# Patient Record
Sex: Female | Born: 1980 | Race: White | Hispanic: No | Marital: Married | Smoking: Current every day smoker
Health system: Southern US, Community
[De-identification: ages and names within clinical notes are randomized; demographics above are authoritative.]

## PROBLEM LIST (undated history)

## (undated) ENCOUNTER — Inpatient Hospital Stay (HOSPITAL_COMMUNITY): Payer: Self-pay

## (undated) DIAGNOSIS — T8859XA Other complications of anesthesia, initial encounter: Secondary | ICD-10-CM

## (undated) DIAGNOSIS — K219 Gastro-esophageal reflux disease without esophagitis: Secondary | ICD-10-CM

## (undated) DIAGNOSIS — Z87442 Personal history of urinary calculi: Secondary | ICD-10-CM

## (undated) DIAGNOSIS — R3915 Urgency of urination: Secondary | ICD-10-CM

## (undated) DIAGNOSIS — F419 Anxiety disorder, unspecified: Secondary | ICD-10-CM

## (undated) DIAGNOSIS — K5090 Crohn's disease, unspecified, without complications: Secondary | ICD-10-CM

## (undated) DIAGNOSIS — T4145XA Adverse effect of unspecified anesthetic, initial encounter: Secondary | ICD-10-CM

## (undated) DIAGNOSIS — N201 Calculus of ureter: Secondary | ICD-10-CM

## (undated) HISTORY — DX: Anxiety disorder, unspecified: F41.9

## (undated) HISTORY — PX: TONSILLECTOMY AND ADENOIDECTOMY: SUR1326

---
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
## 2001-04-15 ENCOUNTER — Emergency Department (HOSPITAL_COMMUNITY): Admission: AC | Admit: 2001-04-15 | Discharge: 2001-04-15 | Payer: Self-pay

## 2001-04-15 ENCOUNTER — Encounter: Payer: Self-pay | Admitting: Emergency Medicine

## 2001-04-17 ENCOUNTER — Emergency Department (HOSPITAL_COMMUNITY): Admission: EM | Admit: 2001-04-17 | Discharge: 2001-04-17 | Payer: Self-pay | Admitting: Emergency Medicine

## 2001-05-23 ENCOUNTER — Emergency Department (HOSPITAL_COMMUNITY): Admission: EM | Admit: 2001-05-23 | Discharge: 2001-05-23 | Payer: Self-pay | Admitting: Emergency Medicine

## 2001-08-28 ENCOUNTER — Emergency Department (HOSPITAL_COMMUNITY): Admission: EM | Admit: 2001-08-28 | Discharge: 2001-08-28 | Payer: Self-pay | Admitting: *Deleted

## 2001-10-03 ENCOUNTER — Emergency Department (HOSPITAL_COMMUNITY): Admission: EM | Admit: 2001-10-03 | Discharge: 2001-10-03 | Payer: Self-pay | Admitting: Emergency Medicine

## 2002-01-15 ENCOUNTER — Emergency Department (HOSPITAL_COMMUNITY): Admission: EM | Admit: 2002-01-15 | Discharge: 2002-01-15 | Payer: Self-pay | Admitting: Emergency Medicine

## 2002-04-07 ENCOUNTER — Emergency Department (HOSPITAL_COMMUNITY): Admission: EM | Admit: 2002-04-07 | Discharge: 2002-04-07 | Payer: Self-pay | Admitting: Emergency Medicine

## 2002-04-29 ENCOUNTER — Emergency Department (HOSPITAL_COMMUNITY): Admission: EM | Admit: 2002-04-29 | Discharge: 2002-04-29 | Payer: Self-pay | Admitting: Emergency Medicine

## 2002-06-15 ENCOUNTER — Emergency Department (HOSPITAL_COMMUNITY): Admission: EM | Admit: 2002-06-15 | Discharge: 2002-06-15 | Payer: Self-pay | Admitting: Emergency Medicine

## 2002-07-03 ENCOUNTER — Encounter: Payer: Self-pay | Admitting: Emergency Medicine

## 2002-07-03 ENCOUNTER — Emergency Department (HOSPITAL_COMMUNITY): Admission: EM | Admit: 2002-07-03 | Discharge: 2002-07-03 | Payer: Self-pay | Admitting: Emergency Medicine

## 2002-09-16 ENCOUNTER — Inpatient Hospital Stay (HOSPITAL_COMMUNITY): Admission: AD | Admit: 2002-09-16 | Discharge: 2002-09-16 | Payer: Self-pay | Admitting: Family Medicine

## 2002-10-10 ENCOUNTER — Emergency Department (HOSPITAL_COMMUNITY): Admission: EM | Admit: 2002-10-10 | Discharge: 2002-10-11 | Payer: Self-pay | Admitting: Emergency Medicine

## 2002-10-10 ENCOUNTER — Encounter: Payer: Self-pay | Admitting: Emergency Medicine

## 2002-12-05 ENCOUNTER — Emergency Department (HOSPITAL_COMMUNITY): Admission: EM | Admit: 2002-12-05 | Discharge: 2002-12-05 | Payer: Self-pay

## 2003-05-13 ENCOUNTER — Emergency Department (HOSPITAL_COMMUNITY): Admission: EM | Admit: 2003-05-13 | Discharge: 2003-05-13 | Payer: Self-pay | Admitting: Emergency Medicine

## 2003-05-15 ENCOUNTER — Emergency Department (HOSPITAL_COMMUNITY): Admission: AD | Admit: 2003-05-15 | Discharge: 2003-05-15 | Payer: Self-pay | Admitting: Emergency Medicine

## 2003-08-01 ENCOUNTER — Emergency Department (HOSPITAL_COMMUNITY): Admission: EM | Admit: 2003-08-01 | Discharge: 2003-08-02 | Payer: Self-pay | Admitting: Emergency Medicine

## 2003-08-02 ENCOUNTER — Encounter: Payer: Self-pay | Admitting: Emergency Medicine

## 2003-10-08 ENCOUNTER — Emergency Department (HOSPITAL_COMMUNITY): Admission: EM | Admit: 2003-10-08 | Discharge: 2003-10-09 | Payer: Self-pay | Admitting: Emergency Medicine

## 2004-01-19 ENCOUNTER — Emergency Department (HOSPITAL_COMMUNITY): Admission: EM | Admit: 2004-01-19 | Discharge: 2004-01-19 | Payer: Self-pay | Admitting: Emergency Medicine

## 2004-02-17 ENCOUNTER — Other Ambulatory Visit: Admission: RE | Admit: 2004-02-17 | Discharge: 2004-02-17 | Payer: Self-pay | Admitting: *Deleted

## 2004-04-26 ENCOUNTER — Emergency Department (HOSPITAL_COMMUNITY): Admission: EM | Admit: 2004-04-26 | Discharge: 2004-04-26 | Payer: Self-pay | Admitting: Family Medicine

## 2004-06-04 ENCOUNTER — Ambulatory Visit (HOSPITAL_COMMUNITY): Admission: RE | Admit: 2004-06-04 | Discharge: 2004-06-04 | Payer: Self-pay | Admitting: *Deleted

## 2004-11-29 ENCOUNTER — Emergency Department (HOSPITAL_COMMUNITY): Admission: EM | Admit: 2004-11-29 | Discharge: 2004-11-29 | Payer: Self-pay | Admitting: Family Medicine

## 2004-12-31 ENCOUNTER — Emergency Department (HOSPITAL_COMMUNITY): Admission: EM | Admit: 2004-12-31 | Discharge: 2004-12-31 | Payer: Self-pay | Admitting: Family Medicine

## 2005-12-29 ENCOUNTER — Emergency Department (HOSPITAL_COMMUNITY): Admission: EM | Admit: 2005-12-29 | Discharge: 2005-12-29 | Payer: Self-pay | Admitting: Emergency Medicine

## 2006-04-11 ENCOUNTER — Emergency Department (HOSPITAL_COMMUNITY): Admission: EM | Admit: 2006-04-11 | Discharge: 2006-04-12 | Payer: Self-pay | Admitting: Emergency Medicine

## 2006-04-11 ENCOUNTER — Emergency Department (HOSPITAL_COMMUNITY): Admission: EM | Admit: 2006-04-11 | Discharge: 2006-04-11 | Payer: Self-pay | Admitting: Emergency Medicine

## 2006-05-05 ENCOUNTER — Ambulatory Visit: Payer: Self-pay | Admitting: Gastroenterology

## 2006-05-09 ENCOUNTER — Ambulatory Visit: Payer: Self-pay | Admitting: Gastroenterology

## 2006-05-10 ENCOUNTER — Ambulatory Visit: Payer: Self-pay | Admitting: Gastroenterology

## 2006-07-24 ENCOUNTER — Emergency Department (HOSPITAL_COMMUNITY): Admission: EM | Admit: 2006-07-24 | Discharge: 2006-07-24 | Payer: Self-pay | Admitting: Family Medicine

## 2006-12-05 ENCOUNTER — Emergency Department (HOSPITAL_COMMUNITY): Admission: EM | Admit: 2006-12-05 | Discharge: 2006-12-06 | Payer: Self-pay | Admitting: Emergency Medicine

## 2007-01-20 ENCOUNTER — Emergency Department (HOSPITAL_COMMUNITY): Admission: EM | Admit: 2007-01-20 | Discharge: 2007-01-20 | Payer: Self-pay | Admitting: Family Medicine

## 2007-07-16 ENCOUNTER — Emergency Department (HOSPITAL_COMMUNITY): Admission: EM | Admit: 2007-07-16 | Discharge: 2007-07-16 | Payer: Self-pay | Admitting: Emergency Medicine

## 2007-12-24 ENCOUNTER — Emergency Department (HOSPITAL_COMMUNITY): Admission: EM | Admit: 2007-12-24 | Discharge: 2007-12-24 | Payer: Self-pay | Admitting: Family Medicine

## 2008-02-23 ENCOUNTER — Emergency Department (HOSPITAL_COMMUNITY): Admission: EM | Admit: 2008-02-23 | Discharge: 2008-02-24 | Payer: Self-pay | Admitting: Emergency Medicine

## 2008-02-26 ENCOUNTER — Ambulatory Visit (HOSPITAL_COMMUNITY): Admission: RE | Admit: 2008-02-26 | Discharge: 2008-02-26 | Payer: Self-pay | Admitting: Orthopedic Surgery

## 2008-02-26 HISTORY — PX: OTHER SURGICAL HISTORY: SHX169

## 2008-04-15 ENCOUNTER — Encounter: Admission: RE | Admit: 2008-04-15 | Discharge: 2008-06-17 | Payer: Self-pay | Admitting: Orthopedic Surgery

## 2008-05-05 ENCOUNTER — Inpatient Hospital Stay (HOSPITAL_COMMUNITY): Admission: AD | Admit: 2008-05-05 | Discharge: 2008-05-05 | Payer: Self-pay | Admitting: Obstetrics & Gynecology

## 2008-05-19 ENCOUNTER — Inpatient Hospital Stay (HOSPITAL_COMMUNITY): Admission: RE | Admit: 2008-05-19 | Discharge: 2008-05-19 | Payer: Self-pay | Admitting: Gynecology

## 2008-09-05 ENCOUNTER — Inpatient Hospital Stay (HOSPITAL_COMMUNITY): Admission: AD | Admit: 2008-09-05 | Discharge: 2008-09-05 | Payer: Self-pay | Admitting: Obstetrics and Gynecology

## 2008-12-16 ENCOUNTER — Inpatient Hospital Stay (HOSPITAL_COMMUNITY): Admission: AD | Admit: 2008-12-16 | Discharge: 2008-12-16 | Payer: Self-pay | Admitting: Obstetrics and Gynecology

## 2008-12-20 ENCOUNTER — Inpatient Hospital Stay (HOSPITAL_COMMUNITY): Admission: AD | Admit: 2008-12-20 | Discharge: 2008-12-20 | Payer: Self-pay | Admitting: Obstetrics and Gynecology

## 2008-12-26 ENCOUNTER — Inpatient Hospital Stay (HOSPITAL_COMMUNITY): Admission: AD | Admit: 2008-12-26 | Discharge: 2008-12-26 | Payer: Self-pay | Admitting: Obstetrics and Gynecology

## 2008-12-28 ENCOUNTER — Inpatient Hospital Stay (HOSPITAL_COMMUNITY): Admission: AD | Admit: 2008-12-28 | Discharge: 2008-12-28 | Payer: Self-pay | Admitting: Obstetrics and Gynecology

## 2008-12-30 ENCOUNTER — Inpatient Hospital Stay (HOSPITAL_COMMUNITY): Admission: AD | Admit: 2008-12-30 | Discharge: 2009-01-04 | Payer: Self-pay | Admitting: Obstetrics and Gynecology

## 2009-05-03 ENCOUNTER — Emergency Department (HOSPITAL_COMMUNITY): Admission: EM | Admit: 2009-05-03 | Discharge: 2009-05-03 | Payer: Self-pay | Admitting: Family Medicine

## 2009-05-14 ENCOUNTER — Emergency Department (HOSPITAL_COMMUNITY): Admission: EM | Admit: 2009-05-14 | Discharge: 2009-05-14 | Payer: Self-pay | Admitting: Emergency Medicine

## 2009-08-09 ENCOUNTER — Emergency Department (HOSPITAL_COMMUNITY): Admission: EM | Admit: 2009-08-09 | Discharge: 2009-08-10 | Payer: Self-pay | Admitting: Emergency Medicine

## 2009-09-26 ENCOUNTER — Emergency Department (HOSPITAL_COMMUNITY): Admission: EM | Admit: 2009-09-26 | Discharge: 2009-09-26 | Payer: Self-pay | Admitting: Family Medicine

## 2009-09-28 ENCOUNTER — Emergency Department (HOSPITAL_COMMUNITY): Admission: EM | Admit: 2009-09-28 | Discharge: 2009-09-28 | Payer: Self-pay | Admitting: Emergency Medicine

## 2009-12-04 ENCOUNTER — Emergency Department (HOSPITAL_COMMUNITY): Admission: EM | Admit: 2009-12-04 | Discharge: 2009-12-04 | Payer: Self-pay | Admitting: Emergency Medicine

## 2010-01-30 ENCOUNTER — Emergency Department (HOSPITAL_COMMUNITY): Admission: EM | Admit: 2010-01-30 | Discharge: 2010-01-30 | Payer: Self-pay | Admitting: Family Medicine

## 2010-07-20 ENCOUNTER — Emergency Department (HOSPITAL_COMMUNITY)
Admission: EM | Admit: 2010-07-20 | Discharge: 2010-07-20 | Payer: Self-pay | Source: Home / Self Care | Admitting: Emergency Medicine

## 2010-12-02 ENCOUNTER — Emergency Department (HOSPITAL_COMMUNITY)
Admission: EM | Admit: 2010-12-02 | Discharge: 2010-12-02 | Payer: Self-pay | Source: Home / Self Care | Admitting: Emergency Medicine

## 2011-01-26 ENCOUNTER — Emergency Department (HOSPITAL_COMMUNITY)
Admission: EM | Admit: 2011-01-26 | Discharge: 2011-01-26 | Disposition: A | Discharge status: Home / Self Care | Payer: Self-pay | Attending: Emergency Medicine | Admitting: Emergency Medicine

## 2011-01-26 DIAGNOSIS — K089 Disorder of teeth and supporting structures, unspecified: Secondary | ICD-10-CM | POA: Insufficient documentation

## 2011-01-26 DIAGNOSIS — K029 Dental caries, unspecified: Secondary | ICD-10-CM | POA: Insufficient documentation

## 2011-01-26 DIAGNOSIS — K5090 Crohn's disease, unspecified, without complications: Secondary | ICD-10-CM | POA: Insufficient documentation

## 2011-01-26 DIAGNOSIS — J45909 Unspecified asthma, uncomplicated: Secondary | ICD-10-CM | POA: Insufficient documentation

## 2011-03-10 LAB — CULTURE, ROUTINE-ABSCESS

## 2011-03-11 LAB — DIFFERENTIAL
Basophils Relative: 0 % (ref 0–1)
Lymphs Abs: 0.7 10*3/uL (ref 0.7–4.0)
Monocytes Absolute: 0.3 10*3/uL (ref 0.1–1.0)
Monocytes Relative: 3 % (ref 3–12)
Neutro Abs: 8.4 10*3/uL — ABNORMAL HIGH (ref 1.7–7.7)
Neutrophils Relative %: 87 % — ABNORMAL HIGH (ref 43–77)

## 2011-03-11 LAB — COMPREHENSIVE METABOLIC PANEL
ALT: 15 U/L (ref 0–35)
AST: 19 U/L (ref 0–37)
CO2: 27 mEq/L (ref 19–32)
Chloride: 102 mEq/L (ref 96–112)
Creatinine, Ser: 0.87 mg/dL (ref 0.4–1.2)
GFR calc Af Amer: >60 mL/min (ref >60)
Glucose, Bld: 125 mg/dL — ABNORMAL HIGH (ref 70–99)
Sodium: 134 mEq/L — ABNORMAL LOW (ref 135–145)
Total Bilirubin: 0.7 mg/dL (ref 0.3–1.2)
Total Protein: 6.6 g/dL (ref 6.0–8.3)

## 2011-03-11 LAB — CBC
Hemoglobin: 13.7 g/dL (ref 12.0–15.0)
MCHC: 34.7 g/dL (ref 30.0–36.0)
MCV: 90.3 fL (ref 78.0–100.0)
RBC: 4.37 MIL/uL (ref 3.87–5.11)
RDW: 12.7 % (ref 11.5–15.5)
WBC: 9.6 10*3/uL (ref 4.0–10.5)

## 2011-03-11 LAB — URINE MICROSCOPIC-ADD ON

## 2011-03-11 LAB — URINALYSIS, ROUTINE W REFLEX MICROSCOPIC
Bilirubin Urine: NEGATIVE
Hgb urine dipstick: NEGATIVE
Protein, ur: NEGATIVE mg/dL
Specific Gravity, Urine: 1.024 (ref 1.005–1.030)

## 2011-03-11 LAB — URINE CULTURE: Culture: NO GROWTH

## 2011-03-11 LAB — LIPASE, BLOOD: Lipase: 15 U/L (ref 11–59)

## 2011-03-21 LAB — CBC
HCT: 33.5 % — ABNORMAL LOW (ref 36.0–46.0)
HCT: 28.8 % — ABNORMAL LOW (ref 36.0–46.0)
HCT: 33.9 % — ABNORMAL LOW (ref 36.0–46.0)
HCT: 34.2 % — ABNORMAL LOW (ref 36.0–46.0)
HCT: 34.7 % — ABNORMAL LOW (ref 36.0–46.0)
Hemoglobin: 11.7 g/dL — ABNORMAL LOW (ref 12.0–15.0)
Hemoglobin: 11.7 g/dL — ABNORMAL LOW (ref 12.0–15.0)
Hemoglobin: 11.9 g/dL — ABNORMAL LOW (ref 12.0–15.0)
Hemoglobin: 11.9 g/dL — ABNORMAL LOW (ref 12.0–15.0)
Hemoglobin: 9.6 g/dL — ABNORMAL LOW (ref 12.0–15.0)
MCHC: 34.5 g/dL (ref 30.0–36.0)
MCHC: 33.5 g/dL (ref 30.0–36.0)
MCHC: 34.4 g/dL (ref 30.0–36.0)
MCHC: 34.4 g/dL (ref 30.0–36.0)
MCHC: 34.6 g/dL (ref 30.0–36.0)
MCV: 94.9 fL (ref 78.0–100.0)
MCV: 95.1 fL (ref 78.0–100.0)
MCV: 96.8 fL (ref 78.0–100.0)
Platelets: 185 10*3/uL (ref 150–400)
Platelets: 185 10*3/uL (ref 150–400)
RBC: 3.53 MIL/uL — ABNORMAL LOW (ref 3.87–5.11)
RBC: 3.6 MIL/uL — ABNORMAL LOW (ref 3.87–5.11)
RBC: 3.64 MIL/uL — ABNORMAL LOW (ref 3.87–5.11)
RDW: 13 % (ref 11.5–15.5)
RDW: 13.3 % (ref 11.5–15.5)
RDW: 13.7 % (ref 11.5–15.5)
WBC: 10.6 10*3/uL — ABNORMAL HIGH (ref 4.0–10.5)
WBC: 9.7 10*3/uL (ref 4.0–10.5)

## 2011-03-21 LAB — COMPREHENSIVE METABOLIC PANEL
ALT: 10 U/L (ref 0–35)
ALT: 13 U/L (ref 0–35)
AST: 15 U/L (ref 0–37)
AST: 18 U/L (ref 0–37)
AST: 19 U/L (ref 0–37)
Albumin: 2.7 g/dL — ABNORMAL LOW (ref 3.5–5.2)
Albumin: 2.7 g/dL — ABNORMAL LOW (ref 3.5–5.2)
Alkaline Phosphatase: 111 U/L (ref 39–117)
Alkaline Phosphatase: 120 U/L — ABNORMAL HIGH (ref 39–117)
Alkaline Phosphatase: 128 U/L — ABNORMAL HIGH (ref 39–117)
BUN: 3 mg/dL — ABNORMAL LOW (ref 6–23)
BUN: 6 mg/dL (ref 6–23)
CO2: 27 mEq/L (ref 19–32)
CO2: 25 mEq/L (ref 19–32)
CO2: 26 mEq/L (ref 19–32)
Calcium: 10.6 mg/dL — ABNORMAL HIGH (ref 8.4–10.5)
Calcium: 9.7 mg/dL (ref 8.4–10.5)
Calcium: 9.9 mg/dL (ref 8.4–10.5)
Chloride: 104 mEq/L (ref 96–112)
Chloride: 104 mEq/L (ref 96–112)
Chloride: 105 mEq/L (ref 96–112)
Creatinine, Ser: 0.61 mg/dL (ref 0.4–1.2)
GFR calc Af Amer: >60 mL/min (ref >60)
GFR calc Af Amer: >60 mL/min (ref >60)
GFR calc Af Amer: >60 mL/min (ref >60)
GFR calc non Af Amer: >60 mL/min (ref >60)
GFR calc non Af Amer: >60 mL/min (ref >60)
GFR calc non Af Amer: >60 mL/min (ref >60)
GFR calc non Af Amer: >60 mL/min (ref >60)
Glucose, Bld: 113 mg/dL — ABNORMAL HIGH (ref 70–99)
Glucose, Bld: 89 mg/dL (ref 70–99)
Glucose, Bld: 94 mg/dL (ref 70–99)
Glucose, Bld: 98 mg/dL (ref 70–99)
Glucose, Bld: 98 mg/dL (ref 70–99)
Potassium: 3.9 mEq/L (ref 3.5–5.1)
Potassium: 3.7 mEq/L (ref 3.5–5.1)
Potassium: 4.1 mEq/L (ref 3.5–5.1)
Sodium: 136 mEq/L (ref 135–145)
Sodium: 138 mEq/L (ref 135–145)
Total Bilirubin: 0.3 mg/dL (ref 0.3–1.2)
Total Bilirubin: 0.4 mg/dL (ref 0.3–1.2)
Total Bilirubin: 0.4 mg/dL (ref 0.3–1.2)
Total Protein: 5.1 g/dL — ABNORMAL LOW (ref 6.0–8.3)
Total Protein: 5.9 g/dL — ABNORMAL LOW (ref 6.0–8.3)
Total Protein: 6 g/dL (ref 6.0–8.3)

## 2011-03-21 LAB — URINALYSIS, ROUTINE W REFLEX MICROSCOPIC
Bilirubin Urine: NEGATIVE
Glucose, UA: NEGATIVE mg/dL
Glucose, UA: NEGATIVE mg/dL
Glucose, UA: NEGATIVE mg/dL
Hgb urine dipstick: NEGATIVE
Hgb urine dipstick: NEGATIVE
Ketones, ur: NEGATIVE mg/dL
Leukocytes, UA: NEGATIVE
Protein, ur: NEGATIVE mg/dL
Specific Gravity, Urine: 1.01 (ref 1.005–1.030)
Urobilinogen, UA: 0.2 mg/dL (ref 0.0–1.0)
Urobilinogen, UA: 0.2 mg/dL (ref 0.0–1.0)
pH: 7 (ref 5.0–8.0)
pH: 7 (ref 5.0–8.0)

## 2011-03-21 LAB — LACTATE DEHYDROGENASE
LDH: 99 U/L (ref 94–250)
LDH: 105 U/L (ref 94–250)
LDH: 106 U/L (ref 94–250)
LDH: 114 U/L (ref 94–250)
LDH: 118 U/L (ref 94–250)

## 2011-03-21 LAB — URIC ACID
Uric Acid, Serum: 5.2 mg/dL (ref 2.4–7.0)
Uric Acid, Serum: 5 mg/dL (ref 2.4–7.0)
Uric Acid, Serum: 5 mg/dL (ref 2.4–7.0)
Uric Acid, Serum: 5.5 mg/dL (ref 2.4–7.0)

## 2011-03-21 LAB — DIFFERENTIAL
Lymphocytes Relative: 17 % (ref 12–46)
Lymphs Abs: 2.1 10*3/uL (ref 0.7–4.0)
Monocytes Absolute: 0.7 10*3/uL (ref 0.1–1.0)
Monocytes Relative: 6 % (ref 3–12)
Neutro Abs: 9.6 10*3/uL — ABNORMAL HIGH (ref 1.7–7.7)

## 2011-04-19 NOTE — Discharge Summary (Signed)
Robin Abbott, Robin Abbott              ACCOUNT NO.:  [id number]   MEDICAL RECORD NO.:  [id number]          PATIENT TYPE:  INP   LOCATION:  9126                          [facility]ILITY:  [facility]   PHYSICIAN:  Janine Limbo, M.D.DATE OF BIRTH:  Oct 22, 1981   DATE OF ADMISSION:  12/30/2008  DATE OF DISCHARGE:  01/04/2009                               DISCHARGE SUMMARY   ADMITTING DIAGNOSES:  1. Intrauterine pregnancy at 39 weeks.  2. Pregnancy-induced hypertension.  3. Unfavorable cervix.   DISCHARGE DIAGNOSES:  1. Intrauterine pregnancy at 39-3/7th weeks.  2. Pregnancy-induced hypertension.  3. Serial induction.   PROCEDURES:  1. Serial induction with Cervidil, Pitocin, and Foley bulb.  2. Normal spontaneous vaginal birth.  3. Repair of right vaginal labia majora tear.   HOSPITAL COURSE:  Robin Abbott is a 30 year old gravida 2, para 0-0-1-0 at  66 weeks, who was admitted from the office on the evening of December 30, 2008, secondary to [facility].  She had elevated blood pressures for  approximately 3-4 weeks and had had several evaluations for preeclampsia  with all negative labs.  She had been placed on labetalol 300 mg t.i.d.  over the last week.  However, blood pressures were still 160/100 in the  office.  Pregnancy had been remarkable for.  1. Crohn disease, diet controlled.  2. Pyelonephritis in 2003.  3. Smoker.  4. Asthma.  5. Fractured leg with metal hardware, showed in March 2009.  6. Pregnancy-induced hypertension.  7. Bacterial vaginosis.  8. Urinary tract infection.  9. Anemia.   On admission, cervix was 1 by office exam.  Cervidil was placed  secondary to the patient already having some contractions that evening.  Cervidil was kept in all night.  The next morning, Cervidil was removed  at approximately 11:10 a.m.  Cervix at that time still was, on  reevaluation, fingertip, 50%, vertex -2.  Pitocin was run until  approximately 5:36 at night with no change in her cervix.  Fetal  heart  remained reactive and blood pressures were stable.  That night, Cervidil  was placed again and it was maintained through till the next day.  Cervix remained unchanged.  Pitocin was restarted on the morning of  January 28 and was run the rest of that day.  The patient was given  options of stopping the induction, going for C-section, or continuing  the induction with possible Foley bulb insertion later that night if  possible.  The patient did wish to proceed with continued induction.  On  the evening of January 28, cervix was reevaluated, it was slightly more  anterior, 1 cm, 60%, vertex at a -2 station and the options were  reviewed with the patient and she did wish to proceed with Foley bulb  induction.  This was placed approximately 9:30 that night.  By the next  morning, Pitocin was begun and cervix was 2-3, 75%, vertex -2.  Artificial rupture of membranes was accomplished at 9:50 with clear  fluid, slightly bloody, epidural placement was placed, and Pitocin was  continued.  Blood pressures were in the 130s-150s over 71-103, but  there  was no sustained elevations.  Fetal heart rate was reactive.  IUPC was  inserted and adequate labor was documented.  By that time, cervix at  2:00 p.m., was 7, 90%, and 0 station.  The patient then progressed well  to complete at 3:40 p.m.  She began pushing at 3:54 and pushed with 3  contractions to a spontaneous vaginal delivery of a viable female, Robin Abbott, at 4:03.  She did have a nuchal cord x3, but was loose.  Apgars  were 9 and 9, weight was 7 pounds.  The patient had a right vaginal  labia majora tear.  This was repaired in the usual fashion.  Blood  pressures after delivery were 128/76 and 156/83.  Labetalol was given  p.o. at that time.  She was maintained on labetalol 100 p.o. b.i.d.   The next morning of January 30, she was doing well.  She was up ad lib.  She did have some right upper quadrant sensitivity.  No visual symptoms   or epigastric pain.  Her blood pressures were 148/82.  Blood pressures  to the night had been 130s-160s over 88-96.  PIH labs were repeated in  the morning of January 30 and were found to be within normal limits.  By  January 31, postpartum day #2, the patient was doing well.  She was  feeling well.  She had no epigastric pain.  No headache, no visual  symptoms, or any other complications.  Obstetrical, she was doing well.  She was undecided regarding contraception.  Her blood pressures were  145/87 and 142/85.  She was maintained on labetalol 100 mg p.o. b.i.d.  Physical exam was within normal limits.  Perineum was healing.  Fundus  was firm.  Lochia was scant.  She was deemed to receive full benefit of  her hospital stay and was discharged home per consult with Dr. Stefano Gaul.   DISCHARGE INSTRUCTIONS:  Per [facility] handout.  PIH  precautions were also reviewed.   DISCHARGE MEDICATIONS:  1. Labetalol 100 mg p.o. b.i.d.  2. Motrin 600 mg p.o. q.6 h. p.r.n. pain.  3. Percocet 5/325 one to two p.o. daily 3-4 hours p.r.n. pain.   DISCHARGE FOLLOWUP:  Will occur by [facility] Nurse on January 06, 2009, with blood pressure values called to [facility], then at [facility] at 6 weeks  or p.r.n.      Robin Abbott, C.N.M.      Janine Limbo, M.D.  Electronically Signed    VLL/MEDQ  D:  01/04/2009  T:  01/04/2009  Job:  [id number]

## 2011-04-19 NOTE — Op Note (Signed)
NAMEEVORA, SCHECHTER              ACCOUNT NO.:  [id number]   MEDICAL RECORD NO.:  [id number]          PATIENT TYPE:  AMB   LOCATION:  SDS                          FACILITY:  [facility]   PHYSICIAN:  Mila Homer. Sherlean Foot, M.D. DATE OF BIRTH:  07/12/1981   DATE OF PROCEDURE:  02/26/2008  DATE OF DISCHARGE:                               OPERATIVE REPORT   PREOPERATIVE DIAGNOSES:  Left ankle fracture, syndesmotic disruption.   POSTOPERATIVE DIAGNOSES:  Left ankle fracture, syndesmotic disruption.   PROCEDURE:  Left ankle open reduction, internal fixation and syndesmotic  repair.   INDICATIONS FOR PROCEDURE:  The patient is a 30 year old with an ankle  injury from the weekend.  Brought in and saw my partner.  An informed  consent was obtained for an ORIF of the ankle.   SURGEON:  Mila Homer. Sherlean Foot, M.D.   DESCRIPTION OF PROCEDURE:  The patient was administered general  anesthesia.  The left leg was prepped and draped in the usual sterile  fashion.  The tourniquet was inflated to 350 mmHg after exsanguination  with an Esmarch.  A lateral incision was made with a #10 blade.  A #15  blade was used to sharply dissect down to the fracture and continue off  the fracture.  I irrigated out the fracture site and then reduced the  bone with a Harken clamp.  I placed a lag screw anterior to posterior,  which reduced the fracture well.  I then placed an Acufex pre-contoured  titanium plate, since the patient had nickel allergy. I placed two  cancellus distal screws, three proximal bicortical screws; and then I  placed a syndesmotic tightrope of fiber wire from [employer].  This reduced  the fracture anatomically, the mortis anatomically. I  verified all on  AP and lateral C-arm imaging.  I then irrigated and closed with #0-2  Arthro sutures and skin staples with a posterior splint.  The tourniquet  time was 32 minutes.  Complications:  None.  Drains:  None.           ______________________________  Mila Homer. Sherlean Foot, M.D.     SDL/MEDQ  D:  02/26/2008  T:  02/26/2008  Job:  [id number]   cc:   Legrand Pitts. Duffy, P.A.
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
## 2011-04-22 NOTE — Op Note (Signed)
[facility]  Patient:    Robin Abbott                    MRN: [id number] Proc. Date: 11/18/99 Adm. Date:  [id number] Attending:  Drema Pry CC:         Carron Curie, M.D.                           Operative Report  PREOPERATIVE DIAGNOSIS: 1. Left knee inflamed medial and lateral plicas. 2. Jumpers knee. 3. Chondromalacia patella.  POSTOPERATIVE DIAGNOSIS: 1. Left knee inflamed medial and lateral plicas. 2. Jumpers knee. 3. Chondromalacia patella.  OPERATION PERFORMED: 1. Left knee operative arthroscopy with excision medial and lateral plicas. 2. Arthroscopic lateral retinacular release. 3. Open excision of jumpers knee with drilling inferior pole of patella.  SURGEON:  Jearld Adjutant, M.D.  ASSISTANT:  Currie Paris. Thedore Mins.  ANESTHESIA:  General with LMA.  CULTURES:  None.  DRAINS:  None.  ESTIMATED BLOOD LOSS:  Minimal.  TOURNIQUET TIME:  Without.  PATHOLOGIC FINDINGS AND HISTORY:  Robin Abbott is a 30 year old female who presented to [place] two months ago with longstanding left knee problems.  No specific injury to he knee.  She saw [facility], who felt she had a runners or jumpers knee.  They put her on no medication but started her on some exercises that did not work.  She was having night pain.  She pointed to the area of the medial femoral condyle and inferior pole of patella when she says she hurts.  On exam, she was  tender inferior pole of patella and pain with resisted knee extension.  She had a mildly positive apprehension test and was tender over medial and lateral plicas. She had a slight recurvatum of the knee with a Q-angle of 18 degrees, 1+ patellofemoral crepitation.  X-rays were negative.  I felt she had a jumpers knee lesion with inferior pole of the patella with patellar tendinitis and a medial nd lateral plica with lateral patellar tracking syndrome and some  chondromalacia. I think the major feature is a medial plica shelf on the inside of the knee.  I injected the medial knee with cortisone-Marcaine and also did an injection over the inferior pole of patella not into the tendon, but over top.  I sent her to physical therapy for VMO strengthening ____________ , ice downs and modalities and put her in a body glove lateral patellar stabilizer.  I kept her out of marching and running for two months, put her on Vioxx.  She came back on October 30, 1999 for follow-up of her knee, still getting popping and giving way of her patella. She hurt under the medial and lateral patellar region and also the patellar tendon region.  She says that injections did not have any longlasting effect.  She was  getting dizzy taking the Vioxx so she stopped it.  Getting the physical therapy was not all that helpful and she had some trouble with transportation problems. She said her knee had been painful for over two years and over the last several months had worsened.  Her mother states it hurts her to the point where she is unable o ambulate on it.  Exam was still consistent with the original findings.  At this  point we elected to do two or three more weeks of physical therapy and at the end of this,  she was still having pain, inflammation, swelling and no progress. Therefore, we saw her yesterday and made the decision to go ahead with plica excision, lateral release and jumpers knee excision.  At surgery, she had a large medial and lateral plica, medial side with underlying fibrous band and an inflamed medial plica directly corresponding to her area of preoperative tenderness. She also had lateral gutter synovitis and plica.  The patella had a tight lateral retinaculum, was tilted and had a soft undersurface patella but not breakdown. The ACL was intact.  Both menisci were intact.  The inferior pole of the patella area did not look healthy in the  tendon.  We excised a strip measuring about 5 x 15 m and roughened the inferior pole of patella insertion, drilled it with 0.62 K-wire to stimulate neovascularity and then closed it over, closing both the retinaculum and the tendon with interrupted and running 0 Vicryl closure.  DESCRIPTION OF PROCEDURE:  With adequate anesthesia obtained using LMA technique, 1 gm Ancef given IV prophylaxis, the patient was placed in the supine position nd the left lower extremity was prepped from the malleoli to the leg holder in the  standard fashion.  After standard prepping and draping, the superior lateral inflow portal was made.  The knee was insufflated with normal saline with arthroscopic  pump.  Medial and lateral scope portals were then made.  The joint was thoroughly inspected.  I then probed the menisci and ACL and the medial plica.  I then shaved the medial plica back to the side wall and lysed the medial band and cauterized  bleeding points.  I reversed portals and did the same on the lateral side.  I then assessed the posterior patella, the patellar tracking and tightness and did an arthroscopic lateral retinacular release with the Arthrocare Bovie from the vastus lateralis to the joint line with good release obtained.  We then irrigated through the scope.  I then repainted the knee with Betadine and made an incision longitudinally over the anterior inferior pole of the patella.  Incision was deepened sharply with a knife and hemostasis obtained using the Bovie electrocoagulator.  The patellar tendon retinaculum was then incised and then I  incised the patellar tendon taking out a strip measuring 5 x 15 mm longitudinally to remove the scarred inferior pole of the patellar tendon.  I then scraped with a rongeur and knife, the inferior pole of the patella insertion and then multiply  drilled it with a 0.62 K-wire.  Irrigation was carried out and I then repaired longitudinally,  the patellar tendon and retinaculum with interrupted and running 0 Vicryl.  The subcutaneous was closed with 3-0 Vicryl and the skin with Monocryl  and with Steri-Strips.  The other portals were left open.  A bulky sterile  compressive dressing was applied with a lateral foam pad for tamponade.  A knee  immobilizer was to be placed.  The patient was to be crutches weightbearing as tolerated, ice around the knee.  Told to call the office for appointment for recheck on Saturday, given Tylox for pain. DD:  11/18/99 TD:  11/19/99 Job: [id number]

## 2011-04-23 ENCOUNTER — Emergency Department (HOSPITAL_COMMUNITY)
Admission: EM | Admit: 2011-04-23 | Discharge: 2011-04-23 | Disposition: A | Discharge status: Home / Self Care | Payer: Self-pay | Attending: Emergency Medicine | Admitting: Emergency Medicine

## 2011-04-23 DIAGNOSIS — R21 Rash and other nonspecific skin eruption: Secondary | ICD-10-CM | POA: Insufficient documentation

## 2011-04-23 DIAGNOSIS — L259 Unspecified contact dermatitis, unspecified cause: Secondary | ICD-10-CM | POA: Insufficient documentation

## 2011-08-29 ENCOUNTER — Inpatient Hospital Stay (INDEPENDENT_AMBULATORY_CARE_PROVIDER_SITE_OTHER)
Admission: RE | Admit: 2011-08-29 | Discharge: 2011-08-29 | Disposition: A | Discharge status: Home / Self Care | Payer: Self-pay | Source: Ambulatory Visit | Attending: Family Medicine | Admitting: Family Medicine

## 2011-08-29 DIAGNOSIS — R6889 Other general symptoms and signs: Secondary | ICD-10-CM

## 2011-08-29 LAB — CBC
Hemoglobin: 13.3
RBC: 4.11
RDW: 12.5

## 2011-08-29 LAB — DIFFERENTIAL
Basophils Absolute: 0
Lymphocytes Relative: 28
Monocytes Absolute: 0.4
Neutro Abs: 4.3
Neutrophils Relative %: 63

## 2011-09-01 LAB — URINALYSIS, ROUTINE W REFLEX MICROSCOPIC
Bilirubin Urine: NEGATIVE
Leukocytes, UA: NEGATIVE
Nitrite: POSITIVE — AB
Specific Gravity, Urine: <1.005 — ABNORMAL LOW
Urobilinogen, UA: 0.2
pH: 5.5

## 2011-09-01 LAB — URINE CULTURE: Colony Count: >=100000

## 2011-09-01 LAB — URINE MICROSCOPIC-ADD ON

## 2011-09-01 LAB — GC/CHLAMYDIA PROBE AMP, GENITAL
Chlamydia, DNA Probe: NEGATIVE
GC Probe Amp, Genital: NEGATIVE

## 2011-09-01 LAB — WET PREP, GENITAL: Yeast Wet Prep HPF POC: NONE SEEN

## 2011-09-05 LAB — WET PREP, GENITAL
Clue Cells Wet Prep HPF POC: NONE SEEN
Trich, Wet Prep: NONE SEEN

## 2011-09-05 LAB — URINALYSIS, ROUTINE W REFLEX MICROSCOPIC
Bilirubin Urine: NEGATIVE
Glucose, UA: NEGATIVE
Hgb urine dipstick: NEGATIVE
Ketones, ur: NEGATIVE
Nitrite: NEGATIVE
Specific Gravity, Urine: 1.01
pH: 6

## 2011-09-05 LAB — GC/CHLAMYDIA PROBE AMP, GENITAL: Chlamydia, DNA Probe: NEGATIVE

## 2011-12-06 NOTE — L&D Delivery Note (Signed)
Delivery Note Pt progressed rapidly, unable to get epidural, began feeling a lot more pressure, and at 12:45 PM a viable female was delivered via Vaginal, Spontaneous Delivery (Presentation: Middle Occiput Anterior with compound posterior hand) in hands and knee position d/t maternal discomfort.  APGAR: 8, 9; weight: pending .   Placenta status: Intact, Spontaneous by dr. Jolayne Panther, as I was called to attend another birth.  Cord: 3 vessels with the following complications: None.    Anesthesia: None  Episiotomy: None Lacerations: 2nd degree;Perineal Suture Repair: 3.0 vicryl by dr. Jolayne Panther Est. Blood Loss (mL): 200  Mom to postpartum.  Baby to nursery-stable.  Desires to breastfeed, wants in-house BTL.  Dr. Jolayne Panther aware. NPO for now.   Marge Duncans 11/03/2012, 1:49 PM

## 2011-12-14 ENCOUNTER — Emergency Department (HOSPITAL_COMMUNITY)
Admission: EM | Admit: 2011-12-14 | Discharge: 2011-12-14 | Disposition: A | Discharge status: Home / Self Care | Payer: Self-pay | Attending: Emergency Medicine | Admitting: Emergency Medicine

## 2011-12-14 ENCOUNTER — Encounter (HOSPITAL_COMMUNITY): Payer: Self-pay | Admitting: Emergency Medicine

## 2011-12-14 DIAGNOSIS — J329 Chronic sinusitis, unspecified: Secondary | ICD-10-CM | POA: Insufficient documentation

## 2011-12-14 DIAGNOSIS — R51 Headache: Secondary | ICD-10-CM | POA: Insufficient documentation

## 2011-12-14 DIAGNOSIS — R6883 Chills (without fever): Secondary | ICD-10-CM | POA: Insufficient documentation

## 2011-12-14 MED ORDER — IBUPROFEN 800 MG PO TABS: 800.0000 mg | ORAL_TABLET | Freq: Three times a day (TID) | ORAL | Status: AC

## 2011-12-14 MED ORDER — TRAMADOL HCL 50 MG PO TABS: 50.0000 mg | ORAL_TABLET | Freq: Four times a day (QID) | ORAL | Status: AC | PRN

## 2011-12-14 MED ORDER — FLUTICASONE PROPIONATE 50 MCG/ACT NA SUSP: 2.0000 | Freq: Every day | NASAL | Status: DC

## 2011-12-14 NOTE — ED Provider Notes (Signed)
History     CSN: [id number]  Arrival date & time 12/14/11  1258   First MD Initiated Contact with Patient 12/14/11 1506      Chief Complaint  Patient presents with  . Facial Pain    Chills, sinus pressure, swelling in l/side of face   4:08 PM HPI Pt complaining of left sided facial pain that began 3 days ago. Reports mild swelling, clear nasal drainage. tearing eye, cough. Denies dental pain, fever, neck pain. Positive sick contact.  Patient is a 31 y.o. female presenting with sinusitis.  Sinusitis  This is a new problem. The current episode started more than 2 days ago. The problem has been gradually worsening. There has been no fever. The pain is at a severity of 5/10. The pain is moderate. The pain has been constant since onset. Associated symptoms include chills, congestion, ear pain, sinus pressure and cough. Pertinent negatives include no sweats, no sore throat and no shortness of breath. Treatments tried: benadryl and aspirin. The treatment provided no relief.    History reviewed. No pertinent past medical history.  Past Surgical History  Procedure Date  . Ankle surgery   . Tonsillectomy     Family History  Problem Relation Age of Onset  . Diabetes Mother   . Hypertension Mother     History  Substance Use Topics  . Smoking status: Current Everyday Smoker  . Smokeless tobacco: Not on file  . Alcohol Use: No    OB History    Grav Para Term Preterm Abortions TAB SAB Ect Mult Living                  Review of Systems  Constitutional: Positive for chills. Negative for fever.  HENT: Positive for ear pain, congestion, rhinorrhea, postnasal drip and sinus pressure. Negative for sore throat, trouble swallowing and dental problem.   Respiratory: Positive for cough. Negative for shortness of breath.   Cardiovascular: Negative for chest pain.  Gastrointestinal: Negative for nausea, vomiting and abdominal pain.  Musculoskeletal: Negative for myalgias.  Neurological:  Positive for headaches. Negative for dizziness, weakness and numbness.  All other systems reviewed and are negative.    Allergies  Hydrocodone and Tetracyclines & related  Home Medications   Current Outpatient Rx  Name Route Sig Dispense Refill  . ASPIRIN EC 81 MG PO TBEC Oral Take 81 mg by mouth every 4 (four) hours as needed. pain    . DIPHENHYDRAMINE HCL 25 MG PO TABS Oral Take 25 mg by mouth at bedtime as needed. sleep      BP 117/84  Pulse 96  Temp(Src) 98.1 F (36.7 C) (Oral)  Resp 18  SpO2 97%  LMP 12/07/2011  Physical Exam  Vitals reviewed. Constitutional: She is oriented to person, place, and time. Vital signs are normal. She appears well-developed and well-nourished.  HENT:  Head: Normocephalic and atraumatic.  Right Ear: Tympanic membrane, external ear and ear canal normal.  Left Ear: Tympanic membrane, external ear and ear canal normal.  Nose: Mucosal edema and rhinorrhea present. Right sinus exhibits no frontal sinus tenderness. Left sinus exhibits maxillary sinus tenderness. Left sinus exhibits no frontal sinus tenderness.  Mouth/Throat: Uvula is midline, oropharynx is clear and moist and mucous membranes are normal.  Eyes: Conjunctivae are normal. Pupils are equal, round, and reactive to light.  Neck: Normal range of motion. Neck supple.  Cardiovascular: Normal rate, regular rhythm and normal heart sounds.  Exam reveals no friction rub.   No murmur heard.  Pulmonary/Chest: Effort normal and breath sounds normal. She has no wheezes. She has no rhonchi. She has no rales. She exhibits no tenderness.  Musculoskeletal: Normal range of motion.  Neurological: She is alert and oriented to person, place, and time. Coordination normal.  Skin: Skin is warm and dry. No rash noted. No erythema. No pallor.    ED Course  Procedures   MDM  Will Treat as acute sinusitis with symptomatic treatment. Advised return to ED for worsening symptoms. agrees w/ the plan and is  ready for discharge       Thomasene Lot, PA-C 12/14/11 1637  Thomasene Lot, PA-C 12/14/11 1639

## 2011-12-15 NOTE — ED Provider Notes (Signed)
Medical screening examination/treatment/procedure(s) were performed by non-physician practitioner and as supervising physician I was immediately available for consultation/collaboration.   Dione Booze, MD 12/15/11 1459

## 2012-03-13 ENCOUNTER — Encounter (HOSPITAL_COMMUNITY): Payer: Self-pay | Admitting: *Deleted

## 2012-03-13 ENCOUNTER — Inpatient Hospital Stay (HOSPITAL_COMMUNITY)
Admission: AD | Admit: 2012-03-13 | Discharge: 2012-03-13 | Disposition: A | Discharge status: Home / Self Care | Payer: Medicaid Other | Source: Ambulatory Visit | Attending: Obstetrics & Gynecology | Admitting: Obstetrics & Gynecology

## 2012-03-13 DIAGNOSIS — Z3200 Encounter for pregnancy test, result unknown: Secondary | ICD-10-CM | POA: Insufficient documentation

## 2012-03-13 HISTORY — DX: Crohn's disease, unspecified, without complications: K50.90

## 2012-03-13 LAB — POCT PREGNANCY, URINE: Preg Test, Ur: POSITIVE — AB

## 2012-03-13 MED ORDER — PRENATAL VITAMINS 28-0.8 MG PO TABS: 1.0000 | ORAL_TABLET | Freq: Every day | ORAL | Status: DC

## 2012-03-13 NOTE — Discharge Instructions (Signed)
ABCs of Pregnancy A Antepartum care is very important. Be sure you see your doctor and get prenatal care as soon as you think you are pregnant. At this time, you will be tested for infection, genetic abnormalities and potential problems with you and the pregnancy. This is the time to discuss diet, exercise, work, medications, labor, pain medication during labor and the possibility of a cesarean delivery. Ask any questions that may concern you. It is important to see your doctor regularly throughout your pregnancy. Avoid exposure to toxic substances and chemicals - such as cleaning solvents, lead and mercury, some insecticides, and paint. Pregnant women should avoid exposure to paint fumes, and fumes that cause you to feel ill, dizzy or faint. When possible, it is a good idea to have a pre-pregnancy consultation with your caregiver to begin some important recommendations your caregiver suggests such as, taking folic acid, exercising, quitting smoking, avoiding alcoholic beverages, etc. B Breastfeeding is the healthiest choice for both you and your baby. It has many nutritional benefits for the baby and health benefits for the mother. It also creates a very tight and loving bond between the baby and mother. Talk to your doctor, your family and friends, and your employer about how you choose to feed your baby and how they can support you in your decision. Not all birth defects can be prevented, but a woman can take actions that may increase her chance of having a healthy baby. Many birth defects happen very early in pregnancy, sometimes before a woman even knows she is pregnant. Birth defects or abnormalities of any child in your or the father's family should be discussed with your caregiver. Get a good support bra as your breast size changes. Wear it especially when you exercise and when nursing.  C Celebrate the news of your pregnancy with the your spouse/father and family. Childbirth classes are helpful to  take for you and the spouse/father because it helps to understand what happens during the pregnancy, labor and delivery. Cesarean delivery should be discussed with your doctor so you are prepared for that possibility. The pros and cons of circumcision if it is a boy, should be discussed with your pediatrician. Cigarette smoking during pregnancy can result in low birth weight babies. It has been associated with infertility, miscarriages, tubal pregnancies, infant death (mortality) and poor health (morbidity) in childhood. Additionally, cigarette smoking may cause long-term learning disabilities. If you smoke, you should try to quit before getting pregnant and not smoke during the pregnancy. Secondary smoke may also harm a mother and her developing baby. It is a good idea to ask people to stop smoking around you during your pregnancy and after the baby is born. Extra calcium is necessary when you are pregnant and is found in your prenatal vitamin, in dairy products, green leafy vegetables and in calcium supplements. D A healthy diet according to your current weight and height, along with vitamins and mineral supplements should be discussed with your caregiver. Domestic abuse or violence should be made known to your doctor right away to get the situation corrected. Drink more water when you exercise to keep hydrated. Discomfort of your back and legs usually develops and progresses from the middle of the second trimester through to delivery of the baby. This is because of the enlarging baby and uterus, which may also affect your balance. Do not take illegal drugs. Illegal drugs can seriously harm the baby and you. Drink extra fluids (water is best) throughout pregnancy to help   your body keep up with the increases in your blood volume. Drink at least 6 to 8 glasses of water, fruit juice, or milk each day. A good way to know you are drinking enough fluid is when your urine looks almost like clear water or is very light  yellow.  E Eat healthy to get the nutrients you and your unborn baby need. Your meals should include the five basic food groups. Exercise (30 minutes of light to moderate exercise a day) is important and encouraged during pregnancy, if there are no medical problems or problems with the pregnancy. Exercise that causes discomfort or dizziness should be stopped and reported to your caregiver. Emotions during pregnancy can change from being ecstatic to depression and should be understood by you, your partner and your family. F Fetal screening with ultrasound, amniocentesis and monitoring during pregnancy and labor is common and sometimes necessary. Take 400 micrograms of folic acid daily both before, when possible, and during the first few months of pregnancy to reduce the risk of birth defects of the brain and spine. All women who could possibly become pregnant should take a vitamin with folic acid, every day. It is also important to eat a healthy diet with fortified foods (enriched grain products, including cereals, rice, breads, and pastas) and foods with natural sources of folate (orange juice, green leafy vegetables, beans, peanuts, broccoli, asparagus, peas, and lentils). The father should be involved with all aspects of the pregnancy including, the prenatal care, childbirth classes, labor, delivery, and postpartum time. Fathers may also have emotional concerns about being a father, financial needs, and raising a family. G Genetic testing should be done appropriately. It is important to know your family and the father's history. If there have been problems with pregnancies or birth defects in your family, report these to your doctor. Also, genetic counselors can talk with you about the information you might need in making decisions about having a family. You can call a major medical center in your area for help in finding a board-certified genetic counselor. Genetic testing and counseling should be done  before pregnancy when possible, especially if there is a history of problems in the mother's or father's family. Certain ethnic backgrounds are more at risk for genetic defects. H Get familiar with the hospital where you will be having your baby. Get to know how long it takes to get there, the labor and delivery area, and the hospital procedures. Be sure your medical insurance is accepted there. Get your home ready for the baby including, clothes, the baby's room (when possible), furniture and car seat. Hand washing is important throughout the day, especially after handling raw meat and poultry, changing the baby's diaper or using the bathroom. This can help prevent the spread of many bacteria and viruses that cause infection. Your hair may become dry and thinner, but will return to normal a few weeks after the baby is born. Heartburn is a common problem that can be treated by taking antacids recommended by your caregiver, eating smaller meals 5 or 6 times a day, not drinking liquids when eating, drinking between meals and raising the head of your bed 2 to 3 inches. I Insurance to cover you, the baby, doctor and hospital should be reviewed so that you will be prepared to pay any costs not covered by your insurance plan. If you do not have medical insurance, there are usually clinics and services available for you in your community. Take 30 milligrams of iron during   your pregnancy as prescribed by your doctor to reduce the risk of low red blood cells (anemia) later in pregnancy. All women of childbearing age should eat a diet rich in iron. J There should be a joint effort for the mother, father and any other children to adapt to the pregnancy financially, emotionally, and psychologically during the pregnancy. Join a support group for moms-to-be. Or, join a class on parenting or childbirth. Have the family participate when possible. K Know your limits. Let your caregiver know if you experience any of the  following:   Pain of any kind.   Strong cramps.   You develop a lot of weight in a short period of time (5 pounds in 3 to 5 days).   Vaginal bleeding, leaking of amniotic fluid.   Headache, vision problems.   Dizziness, fainting, shortness of breath.   Chest pain.   Fever of 102 F (38.9 C) or higher.   Gush of clear fluid from your vagina.   Painful urination.   Domestic violence.   Irregular heartbeat (palpitations).   Rapid beating of the heart (tachycardia).   Constant feeling sick to your stomach (nauseous) and vomiting.   Trouble walking, fluid retention (edema).   Muscle weakness.   If your baby has decreased activity.   Persistent diarrhea.   Abnormal vaginal discharge.   Uterine contractions at 20-minute intervals.   Back pain that travels down your leg.  L Learn and practice that what you eat and drink should be in moderation and healthy for you and your baby. Legal drugs such as alcohol and caffeine are important issues for pregnant women. There is no safe amount of alcohol a woman can drink while pregnant. Fetal alcohol syndrome, a disorder characterized by growth retardation, facial abnormalities, and central nervous system dysfunction, is caused by a woman's use of alcohol during pregnancy. Caffeine, found in tea, coffee, soft drinks and chocolate, should also be limited. Be sure to read labels when trying to cut down on caffeine during pregnancy. More than 200 foods, beverages, and over-the-counter medications contain caffeine and have a high salt content! There are coffees and teas that do not contain caffeine. M Medical conditions such as diabetes, epilepsy, and high blood pressure should be treated and kept under control before pregnancy when possible, but especially during pregnancy. Ask your caregiver about any medications that may need to be changed or adjusted during pregnancy. If you are currently taking any medications, ask your caregiver if it  is safe to take them while you are pregnant or before getting pregnant when possible. Also, be sure to discuss any herbs or vitamins you are taking. They are medicines, too! Discuss with your doctor all medications, prescribed and over-the-counter, that you are taking. During your prenatal visit, discuss the medications your doctor may give you during labor and delivery. N Never be afraid to ask your doctor or caregiver questions about your health, the progress of the pregnancy, family problems, stressful situations, and recommendation for a pediatrician, if you do not have one. It is better to take all precautions and discuss any questions or concerns you may have during your office visits. It is a good idea to write down your questions before you visit the doctor. O Over-the-counter cough and cold remedies may contain alcohol or other ingredients that should be avoided during pregnancy. Ask your caregiver about prescription, herbs or over-the-counter medications that you are taking or may consider taking while pregnant.  P Physical activity during pregnancy can   benefit both you and your baby by lessening discomfort and fatigue, providing a sense of well-being, and increasing the likelihood of early recovery after delivery. Light to moderate exercise during pregnancy strengthens the belly (abdominal) and back muscles. This helps improve posture. Practicing yoga, walking, swimming, and cycling on a stationary bicycle are usually safe exercises for pregnant women. Avoid scuba diving, exercise at high altitudes (over 3000 feet), skiing, horseback riding, contact sports, etc. Always check with your doctor before beginning any kind of exercise, especially during pregnancy and especially if you did not exercise before getting pregnant. Q Queasiness, stomach upset and morning sickness are common during pregnancy. Eating a couple of crackers or dry toast before getting out of bed. Foods that you normally love may  make you feel sick to your stomach. You may need to substitute other nutritious foods. Eating 5 or 6 small meals a day instead of 3 large ones may make you feel better. Do not drink with your meals, drink between meals. Questions that you have should be written down and asked during your prenatal visits. R Read about and make plans to baby-proof your home. There are important tips for making your home a safer environment for your baby. Review the tips and make your home safer for you and your baby. Read food labels regarding calories, salt and fat content in the food. S Saunas, hot tubs, and steam rooms should be avoided while you are pregnant. Excessive high heat may be harmful during your pregnancy. Your caregiver will screen and examine you for sexually transmitted diseases and genetic disorders during your prenatal visits. Learn the signs of labor. Sexual relations while pregnant is safe unless there is a medical or pregnancy problem and your caregiver advises against it. T Traveling long distances should be avoided especially in the third trimester of your pregnancy. If you do have to travel out of state, be sure to take a copy of your medical records and medical insurance plan with you. You should not travel long distances without seeing your doctor first. Most airlines will not allow you to travel after 36 weeks of pregnancy. Toxoplasmosis is an infection caused by a parasite that can seriously harm an unborn baby. Avoid eating undercooked meat and handling cat litter. Be sure to wear gloves when gardening. Tingling of the hands and fingers is not unusual and is due to fluid retention. This will go away after the baby is born. U Womb (uterus) size increases during the first trimester. Your kidneys will begin to function more efficiently. This may cause you to feel the need to urinate more often. You may also leak urine when sneezing, coughing or laughing. This is due to the growing uterus pressing  against your bladder, which lies directly in front of and slightly under the uterus during the first few months of pregnancy. If you experience burning along with frequency of urination or bloody urine, be sure to tell your doctor. The size of your uterus in the third trimester may cause a problem with your balance. It is advisable to maintain good posture and avoid wearing high heels during this time. An ultrasound of your baby may be necessary during your pregnancy and is safe for you and your baby. V Vaccinations are an important concern for pregnant women. Get needed vaccines before pregnancy. Center for Disease Control (www.cdc.gov) has clear guidelines for the use of vaccines during pregnancy. Review the list, be sure to discuss it with your doctor. Prenatal vitamins are helpful   and healthy for you and the baby. Do not take extra vitamins except what is recommended. Taking too much of certain vitamins can cause overdose problems. Continuous vomiting should be reported to your caregiver. Varicose veins may appear especially if there is a family history of varicose veins. They should subside after the delivery of the baby. Support hose helps if there is leg discomfort. W Being overweight or underweight during pregnancy may cause problems. Try to get within 15 pounds of your ideal weight before pregnancy. Remember, pregnancy is not a time to be dieting! Do not stop eating or start skipping meals as your weight increases. Both you and your baby need the calories and nutrition you receive from a healthy diet. Be sure to consult with your doctor about your diet. There is a formula and diet plan available depending on whether you are overweight or underweight. Your caregiver or nutritionist can help and advise you if necessary. X Avoid X-rays. If you must have dental work or diagnostic tests, tell your dentist or physician that you are pregnant so that extra care can be taken. X-rays should only be taken when  the risks of not taking them outweigh the risk of taking them. If needed, only the minimum amount of radiation should be used. When X-rays are necessary, protective lead shields should be used to cover areas of the body that are not being X-rayed. Y Your baby loves you. Breastfeeding your baby creates a loving and very close bond between the two of you. Give your baby a healthy environment to live in while you are pregnant. Infants and children require constant care and guidance. Their health and safety should be carefully watched at all times. After the baby is born, rest or take a nap when the baby is sleeping. Z Get your ZZZs. Be sure to get plenty of rest. Resting on your side as often as possible, especially on your left side is advised. It provides the best circulation to your baby and helps reduce swelling. Try taking a nap for 30 to 45 minutes in the afternoon when possible. After the baby is born rest or take a nap when the baby is sleeping. Try elevating your feet for that amount of time when possible. It helps the circulation in your legs and helps reduce swelling.  Most information courtesy of the CDC. Document Released: 11/21/2005 Document Revised: 11/10/2011 Document Reviewed: 08/05/2009 [employer] Patient Information 2012 [employer]. 

## 2012-03-13 NOTE — [facility] Note (Addendum)
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 Unable to void at this time, water given x 2 cups 1007 report to [facility]U provider--here only for preg test

## 2012-03-13 NOTE — MAU Note (Signed)
Wants to confirm home positive preg test prior to telling husband. No cramping, no bleeding

## 2012-03-16 ENCOUNTER — Encounter (HOSPITAL_COMMUNITY): Payer: Self-pay

## 2012-03-16 ENCOUNTER — Emergency Department (HOSPITAL_COMMUNITY)
Admission: EM | Admit: 2012-03-16 | Discharge: 2012-03-16 | Disposition: A | Discharge status: Home / Self Care | Payer: Medicaid Other | Attending: Emergency Medicine | Admitting: Emergency Medicine

## 2012-03-16 ENCOUNTER — Emergency Department (HOSPITAL_COMMUNITY): Payer: Medicaid Other

## 2012-03-16 DIAGNOSIS — M25476 Effusion, unspecified foot: Secondary | ICD-10-CM | POA: Insufficient documentation

## 2012-03-16 DIAGNOSIS — J45909 Unspecified asthma, uncomplicated: Secondary | ICD-10-CM | POA: Insufficient documentation

## 2012-03-16 DIAGNOSIS — J3489 Other specified disorders of nose and nasal sinuses: Secondary | ICD-10-CM | POA: Insufficient documentation

## 2012-03-16 DIAGNOSIS — R079 Chest pain, unspecified: Secondary | ICD-10-CM | POA: Insufficient documentation

## 2012-03-16 DIAGNOSIS — O9989 Other specified diseases and conditions complicating pregnancy, childbirth and the puerperium: Secondary | ICD-10-CM | POA: Insufficient documentation

## 2012-03-16 DIAGNOSIS — M25473 Effusion, unspecified ankle: Secondary | ICD-10-CM | POA: Insufficient documentation

## 2012-03-16 DIAGNOSIS — R0602 Shortness of breath: Secondary | ICD-10-CM | POA: Insufficient documentation

## 2012-03-16 DIAGNOSIS — R05 Cough: Secondary | ICD-10-CM | POA: Insufficient documentation

## 2012-03-16 DIAGNOSIS — R059 Cough, unspecified: Secondary | ICD-10-CM | POA: Insufficient documentation

## 2012-03-16 LAB — CBC
Hemoglobin: 13.4 g/dL (ref 12.0–15.0)
MCH: 30.9 pg (ref 26.0–34.0)
MCHC: 33.8 g/dL (ref 30.0–36.0)
RDW: 12.7 % (ref 11.5–15.5)

## 2012-03-16 LAB — POCT I-STAT, CHEM 8
Calcium, Ion: 1.2 mmol/L (ref 1.12–1.32)
Chloride: 107 mEq/L (ref 96–112)
Glucose, Bld: 88 mg/dL (ref 70–99)
HCT: 41 % (ref 36.0–46.0)
Hemoglobin: 13.9 g/dL (ref 12.0–15.0)
Potassium: 6.2 mEq/L — ABNORMAL HIGH (ref 3.5–5.1)

## 2012-03-16 LAB — DIFFERENTIAL
Basophils Absolute: 0.1 10*3/uL (ref 0.0–0.1)
Basophils Relative: 1 % (ref 0–1)
Eosinophils Absolute: 0.2 10*3/uL (ref 0.0–0.7)
Monocytes Absolute: 0.8 10*3/uL (ref 0.1–1.0)
Monocytes Relative: 9 % (ref 3–12)
Neutro Abs: 5.9 10*3/uL (ref 1.7–7.7)
Neutrophils Relative %: 65 % (ref 43–77)

## 2012-03-16 MED ORDER — ALBUTEROL SULFATE (5 MG/ML) 0.5% IN NEBU
5.0000 mg | INHALATION_SOLUTION | Freq: Once | RESPIRATORY_TRACT | Status: AC
Start: 2012-03-16 — End: 2012-03-16
  Administered 2012-03-16: 5 mg via RESPIRATORY_TRACT
  Filled 2012-03-16: qty 1

## 2012-03-16 MED ORDER — ALBUTEROL SULFATE HFA 108 (90 BASE) MCG/ACT IN AERS: 1.0000 | INHALATION_SPRAY | Freq: Four times a day (QID) | RESPIRATORY_TRACT | Status: DC | PRN

## 2012-03-16 MED ORDER — IPRATROPIUM BROMIDE 0.02 % IN SOLN
0.5000 mg | Freq: Once | RESPIRATORY_TRACT | Status: AC
  Administered 2012-03-16: 0.5 mg via RESPIRATORY_TRACT
  Filled 2012-03-16: qty 2.5

## 2012-03-16 NOTE — ED Notes (Signed)
[id number]<GN> Expected date:<BR> Expected time:<BR> Means of arrival:<BR> Comments:<BR> Strada, Elease Hashimoto; record locked

## 2012-03-16 NOTE — ED Provider Notes (Signed)
Medical screening examination/treatment/procedure(s) were performed by non-physician practitioner and as supervising physician I was immediately available for consultation/collaboration.  Ethelda Chick, MD 03/16/12 1355

## 2012-03-16 NOTE — ED Provider Notes (Signed)
History     CSN: [id number]  Arrival date & time 03/16/12  5621   First MD Initiated Contact with Patient 03/16/12 1019      Chief Complaint  Patient presents with  . Shortness of Breath    (Consider location/radiation/quality/duration/timing/severity/associated sxs/prior treatment) HPI  Patient presents to the emergency department complaining of acute onset mid chest tightness and shortness of breath. Patient states she woke up and was eating breakfast when she felt her chest becomes tight and became short of breath. Patient states symptoms felt similar to her history of asthma however states she has not had an asthma attack in greater than 10 years. Patient states that symptoms are acute onset however have improved. She has taken nothing for symptoms prior to arrival. Patient states tightness in her chest is aggravated by deep breathing. Patient states she is approximately 4 and half weeks pregnant by home pregnancy test and by pregnancy test at [facility]. Patient is G3 P1, 1 miscarriage. Patient denies fevers, chills, hemoptysis, abdominal pain, nausea, vomiting, diarrhea, dysuria, hematuria, blood in her stool, vaginal bleeding. Patient states she's had a mild cough and congestion over the last few days. Patient states she has some chronic left lower ankle swelling status post ankle surgery 3 years ago but denies any increased swelling or pain. She denies recent travel or immobilization. Patient is taking pre-natal vitamins but takes no other medicines on a regular basis.  Past Medical History  Diagnosis Date  . Hypertension in pregnancy 2009  . Hx MRSA infection 2010  . Crohn's disease     Past Surgical History  Procedure Date  . Ankle surgery   . Tonsillectomy     Family History  Problem Relation Age of Onset  . Diabetes Mother   . Hypertension Mother   . Anesthesia problems Neg Hx     History  Substance Use Topics  . Smoking status: Former Games developer  . Smokeless  tobacco: Not on file  . Alcohol Use: No    OB History    Grav Para Term Preterm Abortions TAB SAB Ect Mult Living   3 1 1  0 1 0 1 0 0 1      Review of Systems  All other systems reviewed and are negative.    Allergies  Hydrocodone and Tetracyclines & related  Home Medications   Current Outpatient Rx  Name Route Sig Dispense Refill  . PRENATAL VITAMINS 28-0.8 MG PO TABS Oral Take 1 tablet by mouth daily. 30 tablet 5    BP 126/73  Pulse 76  Temp(Src) 97.9 F (36.6 C) (Oral)  Resp 18  SpO2 100%  LMP 02/09/2012  Physical Exam  Nursing note and vitals reviewed. Constitutional: She is oriented to person, place, and time. She appears well-developed and well-nourished. No distress.  HENT:  Head: Normocephalic and atraumatic.  Eyes: Conjunctivae are normal.  Neck: Normal range of motion. Neck supple.  Cardiovascular: Normal rate, regular rhythm, normal heart sounds and intact distal pulses.  Exam reveals no gallop and no friction rub.   No murmur heard. Pulmonary/Chest: Effort normal and breath sounds normal. No respiratory distress. She has no wheezes. She has no rales. She exhibits tenderness.       Mild TTP of upper anterior chest wall but no skin changes  Abdominal: Bowel sounds are normal. She exhibits no distension and no mass. There is no tenderness. There is no rebound and no guarding.  Musculoskeletal: Normal range of motion. She exhibits no edema and no  tenderness.  Neurological: She is alert and oriented to person, place, and time.  Skin: Skin is warm and dry. No rash noted. She is not diaphoretic. No erythema.  Psychiatric: She has a normal mood and affect.    ED Course  Procedures (including critical care time)  Neb albuterol/atrovent   Date: 03/16/2012  Rate: 58  Rhythm: normal sinus rhythm  QRS Axis: normal  Intervals: normal  ST/T Wave abnormalities: normal  Conduction Disutrbances: none  Narrative Interpretation:   Old EKG Reviewed: none for  comparison. Non provocative  12:22 PM Repeat potassium lab pending. Question error.    Labs Reviewed  POCT I-STAT, CHEM 8 - Abnormal; Notable for the following:    Potassium 6.2 (*)    All other components within normal limits  CBC  DIFFERENTIAL  D-DIMER, QUANTITATIVE  POTASSIUM   Dg Chest 2 View  03/16/2012  *RADIOLOGY REPORT*  Clinical Data: Shortness of breath and chest pain.  Pregnancy.  CHEST - 2 VIEW  Comparison: 01/30/2010.  Findings:  The patient was double shielded for the exam.  The heart size and mediastinal contours are within normal limits. Both lungs are clear.  The visualized skeletal structures are unremarkable. No change from priors.  IMPRESSION: No active cardiopulmonary disease.  Original Report Authenticated By: Elsie Stain, M.D.   1:05 PM Patient states chest tightness and SOB have completely resolved after neb treatment. Patient is scheduled to see [facility] in 1-2 weeks for prenatal care.   1. Asthma       MDM  Is afebrile, nontoxic-appearing, in no respiratory distress. Normal lung exam. Pulse ox 100% room air. D-dimer was not elevated. Symptoms resolved after neb treatment. Repeat potassium is normal with normal EKG. Patient is agreeable to following up with OB/GYN as scheduled in 1-2 weeks. Discussed signs and symptoms that should warrant immediate return to emergency department. Patient voices her understanding and is agreeable to plan.        Jenness Corner, [place] 03/16/12 1319

## 2012-03-16 NOTE — Discharge Instructions (Signed)
Continue to take your prenatal vitamins. Use albuterol inhaler as needed for shortness of breath and cough however return to emergency department for any changing or worsening symptoms. Followup with her OB/GYN for ongoing prenatal care is also very important.  Asthma Attack Prevention HOW CAN ASTHMA BE PREVENTED? Currently, there is no way to prevent asthma from starting. However, you can take steps to control the disease and prevent its symptoms after you have been diagnosed. Learn about your asthma and how to control it. Take an active role to control your asthma by working with your caregiver to create and follow an asthma action plan. An asthma action plan guides you in taking your medicines properly, avoiding factors that make your asthma worse, tracking your level of asthma control, responding to worsening asthma, and seeking emergency care when needed. To track your asthma, keep records of your symptoms, check your peak flow number using a peak flow meter (handheld device that shows how well air moves out of your lungs), and get regular asthma checkups.  Other ways to prevent asthma attacks include:  Use medicines as your caregiver directs.   Identify and avoid things that make your asthma worse (as much as you can).   Keep track of your asthma symptoms and level of control.   Get regular checkups for your asthma.   With your caregiver, write a detailed plan for taking medicines and managing an asthma attack. Then be sure to follow your action plan. Asthma is an ongoing condition that needs regular monitoring and treatment.   Identify and avoid asthma triggers. A number of outdoor allergens and irritants (pollen, mold, cold air, air pollution) can trigger asthma attacks. Find out what causes or makes your asthma worse, and take steps to avoid those triggers (see below).   Monitor your breathing. Learn to recognize warning signs of an attack, such as slight coughing, wheezing or shortness  of breath. However, your lung function may already decrease before you notice any signs or symptoms, so regularly measure and record your peak airflow with a home peak flow meter.   Identify and treat attacks early. If you act quickly, you're less likely to have a severe attack. You will also need less medicine to control your symptoms. When your peak flow measurements decrease and alert you to an upcoming attack, take your medicine as instructed, and immediately stop any activity that may have triggered the attack. If your symptoms do not improve, get medical help.   Pay attention to increasing quick-relief inhaler use. If you find yourself relying on your quick-relief inhaler (such as albuterol), your asthma is not under control. See your caregiver about adjusting your treatment.  IDENTIFY AND CONTROL FACTORS THAT MAKE YOUR ASTHMA WORSE A number of common things can set off or make your asthma symptoms worse (asthma triggers). Keep track of your asthma symptoms for several weeks, detailing all the environmental and emotional factors that are linked with your asthma. When you have an asthma attack, go back to your asthma diary to see which factor, or combination of factors, might have contributed to it. Once you know what these factors are, you can take steps to control many of them.  Allergies: If you have allergies and asthma, it is important to take asthma prevention steps at home. Asthma attacks (worsening of asthma symptoms) can be triggered by allergies, which can cause temporary increased inflammation of your airways. Minimizing contact with the substance to which you are allergic will help prevent an asthma  attack. Animal Dander:   Some people are allergic to the flakes of skin or dried saliva from animals with fur or feathers. Keep these pets out of your home.   If you can't keep a pet outdoors, keep the pet out of your bedroom and other sleeping areas at all times, and keep the door closed.     Remove carpets and furniture covered with cloth from your home. If that is not possible, keep the pet away from fabric-covered furniture and carpets.  Dust Mites:  Many people with asthma are allergic to dust mites. Dust mites are tiny bugs that are found in every home, in mattresses, pillows, carpets, fabric-covered furniture, bedcovers, clothes, stuffed toys, fabric, and other fabric-covered items.   Cover your mattress in a special dust-proof cover.   Cover your pillow in a special dust-proof cover, or wash the pillow each week in hot water. Water must be hotter than 130 F to kill dust mites. Cold or warm water used with detergent and bleach can also be effective.   Wash the sheets and blankets on your bed each week in hot water.   Try not to sleep or lie on cloth-covered cushions.   Call ahead when traveling and ask for a smoke-free hotel room. Bring your own bedding and pillows, in case the hotel only supplies feather pillows and down comforters, which may contain dust mites and cause asthma symptoms.   Remove carpets from your bedroom and those laid on concrete, if you can.   Keep stuffed toys out of the bed, or wash the toys weekly in hot water or cooler water with detergent and bleach.  Cockroaches:  Many people with asthma are allergic to the droppings and remains of cockroaches.   Keep food and garbage in closed containers. Never leave food out.   Use poison baits, traps, powders, gels, or paste (for example, boric acid).   If a spray is used to kill cockroaches, stay out of the room until the odor goes away.  Indoor Mold:  Fix leaky faucets, pipes, or other sources of water that have mold around them.   Clean moldy surfaces with a cleaner that has bleach in it.  Pollen and Outdoor Mold:  When pollen or mold spore counts are high, try to keep your windows closed.   Stay indoors with windows closed from late morning to afternoon, if you can. Pollen and some mold  spore counts are highest at that time.   Ask your caregiver whether you need to take or increase anti-inflammatory medicine before your allergy season starts.  Irritants:   Tobacco smoke is an irritant. If you smoke, ask your caregiver how you can quit. Ask family members to quit smoking, too. Do not allow smoking in your home or car.   If possible, do not use a wood-burning stove, kerosene heater, or fireplace. Minimize exposure to all sources of smoke, including incense, candles, fires, and fireworks.   Try to stay away from strong odors and sprays, such as perfume, talcum powder, hair spray, and paints.   Decrease humidity in your home and use an indoor air cleaning device. Reduce indoor humidity to below 60 percent. Dehumidifiers or central air conditioners can do this.   Try to have someone else vacuum for you once or twice a week, if you can. Stay out of rooms while they are being vacuumed and for a short while afterward.   If you vacuum, use a dust mask from a hardware store, a  double-layered or microfilter vacuum cleaner bag, or a vacuum cleaner with a HEPA filter.   Sulfites in foods and beverages can be irritants. Do not drink beer or wine, or eat dried fruit, processed potatoes, or shrimp if they cause asthma symptoms.   Cold air can trigger an asthma attack. Cover your nose and mouth with a scarf on cold or windy days.   Several health conditions can make asthma more difficult to manage, including runny nose, sinus infections, reflux disease, psychological stress, and sleep apnea. Your caregiver will treat these conditions, as well.   Avoid close contact with people who have a cold or the flu, since your asthma symptoms may get worse if you catch the infection from them. Wash your hands thoroughly after touching items that may have been handled by people with a respiratory infection.   Get a flu shot every year to protect against the flu virus, which often makes asthma worse for  days or weeks. Also get a pneumonia shot once every five to 10 years.  Drugs:  Aspirin and other painkillers can cause asthma attacks. 10% to 20% of people with asthma have sensitivity to aspirin or a group of painkillers called non-steroidal anti-inflammatory drugs (NSAIDS), such as ibuprofen and naproxen. These drugs are used to treat pain and reduce fevers. Asthma attacks caused by any of these medicines can be severe and even fatal. These drugs must be avoided in people who have known aspirin sensitive asthma. Products with acetaminophen are considered safe for people who have asthma. It is important that people with aspirin sensitivity read labels of all over-the-counter drugs used to treat pain, colds, coughs, and fever.   Beta blockers and ACE inhibitors are other drugs which you should discuss with your caregiver, in relation to your asthma.  ALLERGY SKIN TESTING  Ask your asthma caregiver about allergy skin testing or blood testing (RAST test) to identify the allergens to which you are sensitive. If you are found to have allergies, allergy shots (immunotherapy) for asthma may help prevent future allergies and asthma. With allergy shots, small doses of allergens (substances to which you are allergic) are injected under your skin on a regular schedule. Over a period of time, your body may become used to the allergen and less responsive with asthma symptoms. You can also take measures to minimize your exposure to those allergens. EXERCISE  If you have exercise-induced asthma, or are planning vigorous exercise, or exercise in cold, humid, or dry environments, prevent exercise-induced asthma by following your caregiver's advice regarding asthma treatment before exercising. Document Released: 11/09/2009 Document Revised: 11/10/2011 Document Reviewed: 11/09/2009 [facility] Patient Information 2012 [place], [place].

## 2012-03-16 NOTE — ED Notes (Signed)
RT at bedside to administer breathing treatment.

## 2012-03-16 NOTE — ED Notes (Addendum)
Pt c/o sob onset this morning hx of asthma, haven't has asthma attack for 10 years, sat 100% RA, dry cough, no fever, breath sound clear, and pain at center chest that is worsen with deep breath. Also [redacted] weeks pregnant

## 2012-03-18 ENCOUNTER — Encounter (HOSPITAL_COMMUNITY): Payer: Self-pay | Admitting: *Deleted

## 2012-03-18 ENCOUNTER — Inpatient Hospital Stay (HOSPITAL_COMMUNITY)
Admission: AD | Admit: 2012-03-18 | Discharge: 2012-03-18 | Disposition: A | Discharge status: Hospice | Payer: Medicaid Other | Source: Ambulatory Visit | Attending: Obstetrics & Gynecology | Admitting: Obstetrics & Gynecology

## 2012-03-18 DIAGNOSIS — O210 Mild hyperemesis gravidarum: Secondary | ICD-10-CM | POA: Insufficient documentation

## 2012-03-18 DIAGNOSIS — O219 Vomiting of pregnancy, unspecified: Secondary | ICD-10-CM

## 2012-03-18 LAB — URINALYSIS, ROUTINE W REFLEX MICROSCOPIC
Glucose, UA: NEGATIVE mg/dL
Ketones, ur: NEGATIVE mg/dL
Leukocytes, UA: NEGATIVE
pH: 7.5 (ref 5.0–8.0)

## 2012-03-18 MED ORDER — ONDANSETRON 8 MG PO TBDP: 8.0000 mg | ORAL_TABLET | Freq: Three times a day (TID) | ORAL | Status: AC | PRN

## 2012-03-18 MED ORDER — ONDANSETRON 8 MG PO TBDP
8.0000 mg | ORAL_TABLET | Freq: Once | ORAL | Status: AC
  Administered 2012-03-18: 8 mg via ORAL
  Filled 2012-03-18: qty 1

## 2012-03-18 NOTE — MAU Provider Note (Addendum)
History     CSN: [id number]  Arrival date and time: 03/18/12 1655   First Provider Initiated Contact with Patient 03/18/12 1735      Chief Complaint  Patient presents with  . Emesis   HPI 31 y.o. G3P1011 at [redacted]w[redacted]d with n/v x 3 days, worse today, not able to keep anything down. Has appt at [facility] in a couple of weeks.    Past Medical History  Diagnosis Date  . Hypertension in pregnancy 2009  . Hx MRSA infection 2010    tested last year for mrsa and it was negative.  . Crohn's disease   . Asthma     Past Surgical History  Procedure Date  . Ankle surgery   . Tonsillectomy     Family History  Problem Relation Age of Onset  . Diabetes Mother   . Hypertension Mother   . Anesthesia problems Neg Hx     History  Substance Use Topics  . Smoking status: Former Games developer  . Smokeless tobacco: Not on file  . Alcohol Use: No    Allergies:  Allergies  Allergen Reactions  . Hydrocodone Hives  . Tetracyclines & Related Nausea And Vomiting    Prescriptions prior to admission  Medication Sig Dispense Refill  . acetaminophen (TYLENOL) 500 MG tablet Take 1,000 mg by mouth every 6 (six) hours as needed. For pain      . albuterol (PROVENTIL HFA;VENTOLIN HFA) 108 (90 BASE) MCG/ACT inhaler Inhale 1-2 puffs into the lungs every 6 (six) hours as needed for wheezing.  1 Inhaler  0  . Prenatal Vit-Fe Fumarate-FA (PRENATAL VITAMINS) 28-0.8 MG TABS Take 1 tablet by mouth daily.  30 tablet  5    Review of Systems  Constitutional: Negative.   Respiratory: Negative.   Cardiovascular: Negative.   Gastrointestinal: Positive for nausea and vomiting. Negative for abdominal pain, diarrhea and constipation.  Genitourinary: Negative for dysuria, urgency, frequency, hematuria and flank pain.       Negative for vaginal bleeding, vaginal discharge  Musculoskeletal: Negative.   Neurological: Negative.   Psychiatric/Behavioral: Negative.    Physical Exam   Blood pressure 136/81, pulse  84, temperature 97.6 F (36.4 C), temperature source Oral, resp. rate 18, height 5\' 3"  (1.6 m), weight 209 lb 12.8 oz (95.165 kg), last menstrual period 02/09/2012.  Physical Exam  Nursing note and vitals reviewed. Constitutional: She is oriented to person, place, and time. She appears well-developed and well-nourished. No distress.  HENT:  Head: Normocephalic and atraumatic.  Cardiovascular: Normal rate.   Respiratory: Effort normal. No respiratory distress.  Genitourinary: Uterus is not deviated, not enlarged, not fixed and not tender. Cervix exhibits no motion tenderness, no discharge and no friability. Right adnexum displays no mass, no tenderness and no fullness. Left adnexum displays no mass, no tenderness and no fullness.  Neurological: She is alert and oriented to person, place, and time.  Skin: Skin is warm and dry.  Psychiatric: She has a normal mood and affect.    MAU Course  Procedures  Results for orders placed during the hospital encounter of 03/18/12 (from the past 24 hour(s))  URINALYSIS, ROUTINE W REFLEX MICROSCOPIC     Status: Normal   Collection Time   03/18/12  5:05 PM      Component Value Range   Color, Urine YELLOW  YELLOW    APPearance CLEAR  CLEAR    Specific Gravity, Urine 1.015  1.005 - 1.030    pH 7.5  5.0 - 8.0  Glucose, UA NEGATIVE  NEGATIVE (mg/dL)   Hgb urine dipstick NEGATIVE  NEGATIVE    Bilirubin Urine NEGATIVE  NEGATIVE    Ketones, ur NEGATIVE  NEGATIVE (mg/dL)   Protein, ur NEGATIVE  NEGATIVE (mg/dL)   Urobilinogen, UA 1.0  0.0 - 1.0 (mg/dL)   Nitrite NEGATIVE  NEGATIVE    Leukocytes, UA NEGATIVE  NEGATIVE      Assessment and Plan  Care assumed by Wynelle Bourgeois, CNM  [facility] 03/18/2012, 5:36 PM   Feels better after Zofran. I delivered her first baby when I was in private practice, btw Eating tips reviewed including small sips and bites and eating before rising in AM Has appt coming up at [facility].  Will Rx Zofran and if  she experiences signs of dehydration, she will return

## 2012-03-18 NOTE — MAU Provider Note (Signed)
Medical Screening exam and patient care preformed by advanced practice provider.  Agree with the above management.  

## 2012-03-18 NOTE — MAU Note (Signed)
Has is 5 wks and has  been vomiting for three days and it worsened today.

## 2012-03-18 NOTE — MAU Note (Signed)
Pt reports having n/v x 3 days. Can't keep anything down. C/o stomach crampingas well

## 2012-03-18 NOTE — Discharge Instructions (Signed)
Morning Sickness Morning sickness is when you feel sick to your stomach (nauseous) during pregnancy. You may feel sick to your stomach and throw up (vomit). You may feel sick in the morning, but you can feel this way any time of day. Some women feel very sick to their stomach and cannot stop throwing up (hyperemesis gravidarum). HOME CARE  Take multivitamins as told by your doctor. Taking multivitamins before getting pregnant can stop or lessen the harshness of morning sickness.   Eat dry toast or unsalted crackers before getting out of bed.   Eat 5 to 6 small meals a day.   Eat dry and bland foods like rice and baked potatoes.   Do not drink liquids with meals. Drink between meals.   Do not eat greasy, fatty, or spicy foods.   Have someone cook for you if the smell of food causes you to feel sick or throw up.   Do not take vitamins with iron, or as told by your doctor.   Eat protein when you need a snack (nuts, yogurt, cheese).   Eat unsweetened gelatins for dessert.   Wear a bracelet used for sea sickness (acupressure wristband).   Go to a doctor that puts thin needles into certain body points (acupuncture) to improve how you feel.   Do not smoke.   Use a humidifier to keep the air in your house free of odors.  GET HELP RIGHT AWAY IF:   You feel very sick to your stomach and cannot stop throwing up.   You pass out (faint).   You have a fever.   You need medicine to feel better.   You feel dizzy or lightheaded.   You are losing weight.   You need help knowing what to eat and what not to eat.  MAKE SURE YOU:   Understand these instructions.   Will watch your condition.   Will get help right away if you are not doing well or get worse.  Document Released: 12/29/2004 Document Revised: 11/10/2011 Document Reviewed: 02/18/2010 [employer] Patient Information 2012 [employer]. 

## 2012-03-19 NOTE — MAU Provider Note (Signed)
Medical Screening exam and patient care preformed by advanced practice provider.  Agree with the above management.  

## 2012-03-28 ENCOUNTER — Ambulatory Visit (INDEPENDENT_AMBULATORY_CARE_PROVIDER_SITE_OTHER): Payer: Self-pay | Admitting: Gynecology

## 2012-03-28 ENCOUNTER — Encounter: Payer: Self-pay | Admitting: Gynecology

## 2012-03-28 VITALS — BP 131/82 | Wt 214.0 lb

## 2012-03-28 DIAGNOSIS — O3680X0 Pregnancy with inconclusive fetal viability, not applicable or unspecified: Secondary | ICD-10-CM

## 2012-03-28 DIAGNOSIS — Z3480 Encounter for supervision of other normal pregnancy, unspecified trimester: Secondary | ICD-10-CM

## 2012-03-29 LAB — OBSTETRIC PANEL
Antibody Screen: NEGATIVE
HCT: 39.7 % (ref 36.0–46.0)
Hemoglobin: 12.8 g/dL (ref 12.0–15.0)
Lymphocytes Relative: 23 % (ref 12–46)
Lymphs Abs: 2.8 10*3/uL (ref 0.7–4.0)
MCHC: 32.2 g/dL (ref 30.0–36.0)
Monocytes Absolute: 0.6 10*3/uL (ref 0.1–1.0)
Monocytes Relative: 5 % (ref 3–12)
Neutro Abs: 8.4 10*3/uL — ABNORMAL HIGH (ref 1.7–7.7)
Rh Type: POSITIVE
Rubella: 20.3 IU/mL — ABNORMAL HIGH
WBC: 12.1 10*3/uL — ABNORMAL HIGH (ref 4.0–10.5)

## 2012-04-02 ENCOUNTER — Ambulatory Visit (INDEPENDENT_AMBULATORY_CARE_PROVIDER_SITE_OTHER): Payer: Self-pay | Admitting: Obstetrics & Gynecology

## 2012-04-02 VITALS — BP 115/75 | Wt 210.0 lb

## 2012-04-02 DIAGNOSIS — Z3480 Encounter for supervision of other normal pregnancy, unspecified trimester: Secondary | ICD-10-CM

## 2012-04-02 DIAGNOSIS — Z124 Encounter for screening for malignant neoplasm of cervix: Secondary | ICD-10-CM

## 2012-04-02 DIAGNOSIS — Z3490 Encounter for supervision of normal pregnancy, unspecified, unspecified trimester: Secondary | ICD-10-CM

## 2012-04-02 DIAGNOSIS — K5090 Crohn's disease, unspecified, without complications: Secondary | ICD-10-CM

## 2012-04-02 DIAGNOSIS — O2341 Unspecified infection of urinary tract in pregnancy, first trimester: Secondary | ICD-10-CM

## 2012-04-02 DIAGNOSIS — Z113 Encounter for screening for infections with a predominantly sexual mode of transmission: Secondary | ICD-10-CM

## 2012-04-02 MED ORDER — AMOXICILLIN 500 MG PO CAPS: 500.0000 mg | ORAL_CAPSULE | Freq: Three times a day (TID) | ORAL | Status: AC
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
## 2012-04-02 NOTE — Patient Instructions (Addendum)
Pregnancy - First Trimester  During sexual intercourse, millions of sperm go into the vagina. Only 1 sperm will penetrate and fertilize the female egg while it is in the Fallopian tube. One week later, the fertilized egg implants into the wall of the uterus. An embryo begins to develop into a baby. At 6 to 8 weeks, the eyes and face are formed and the heartbeat can be seen on ultrasound. At the end of 12 weeks (first trimester), all the baby's organs are formed. Now that you are pregnant, you will want to do everything you can to have a healthy baby. Two of the most important things are to get good prenatal care and follow your caregiver's instructions. Prenatal care is all the medical care you receive before the baby's birth. It is given to prevent, find, and treat problems during the pregnancy and childbirth.  PRENATAL EXAMS   During prenatal visits, your weight, blood pressure and urine are checked. This is done to make sure you are healthy and progressing normally during the pregnancy.   A pregnant woman should gain 25 to 35 pounds during the pregnancy. However, if you are over weight or underweight, your caregiver will advise you regarding your weight.   Your caregiver will ask and answer questions for you.   Blood work, cervical cultures, other necessary tests and a Pap test are done during your prenatal exams. These tests are done to check on your health and the probable health of your baby. Tests are strongly recommended and done for HIV with your permission. This is the virus that causes AIDS. These tests are done because medications can be given to help prevent your baby from being born with this infection should you have been infected without knowing it. Blood work is also used to find out your blood type, previous infections and follow your blood levels (hemoglobin).   Low hemoglobin (anemia) is common during pregnancy. Iron and vitamins are given to help prevent this. Later in the pregnancy, blood  tests for diabetes will be done along with any other tests if any problems develop. You may need tests to make sure you and the baby are doing well.   You may need other tests to make sure you and the baby are doing well.  CHANGES DURING THE FIRST TRIMESTER (THE FIRST 3 MONTHS OF PREGNANCY)  Your body goes through many changes during pregnancy. They vary from person to person. Talk to your caregiver about changes you notice and are concerned about. Changes can include:   Your menstrual period stops.   The egg and sperm carry the genes that determine what you look like. Genes from you and your partner are forming a baby. The female genes determine whether the baby is a boy or a girl.   Your body increases in girth and you may feel bloated.   Feeling sick to your stomach (nauseous) and throwing up (vomiting). If the vomiting is uncontrollable, call your caregiver.   Your breasts will begin to enlarge and become tender.   Your nipples may stick out more and become darker.   The need to urinate more. Painful urination may mean you have a bladder infection.   Tiring easily.   Loss of appetite.   Cravings for certain kinds of food.   At first, you may gain or lose a couple of pounds.   You may have changes in your emotions from day to day (excited to be pregnant or concerned something may go wrong with   the pregnancy and baby).   You may have more vivid and strange dreams.  HOME CARE INSTRUCTIONS    It is very important to avoid all smoking, alcohol and un-prescribed drugs during your pregnancy. These affect the formation and growth of the baby. Avoid chemicals while pregnant to ensure the delivery of a healthy infant.   Start your prenatal visits by the 12th week of pregnancy. They are usually scheduled monthly at first, then more often in the last 2 months before delivery. Keep your caregiver's appointments. Follow your caregiver's instructions regarding medication use, blood and lab tests, exercise, and  diet.   During pregnancy, you are providing food for you and your baby. Eat regular, well-balanced meals. Choose foods such as meat, fish, milk and other low fat dairy products, vegetables, fruits, and whole-grain breads and cereals. Your caregiver will tell you of the ideal weight gain.   You can help morning sickness by keeping soda crackers at the bedside. Eat a couple before arising in the morning. You may want to use the crackers without salt on them.   Eating 4 to 5 small meals rather than 3 large meals a day also may help the nausea and vomiting.   Drinking liquids between meals instead of during meals also seems to help nausea and vomiting.   A physical sexual relationship may be continued throughout pregnancy if there are no other problems. Problems may be early (premature) leaking of amniotic fluid from the membranes, vaginal bleeding, or belly (abdominal) pain.   Exercise regularly if there are no restrictions. Check with your caregiver or physical therapist if you are unsure of the safety of some of your exercises. Greater weight gain will occur in the last 2 trimesters of pregnancy. Exercising will help:   Control your weight.   Keep you in shape.   Prepare you for labor and delivery.   Help you lose your pregnancy weight after you deliver your baby.   Wear a good support or jogging bra for breast tenderness during pregnancy. This may help if worn during sleep too.   Ask when prenatal classes are available. Begin classes when they are offered.   Do not use hot tubs, steam rooms or saunas.   Wear your seat belt when driving. This protects you and your baby if you are in an accident.   Avoid raw meat, uncooked cheese, cat litter boxes and soil used by cats throughout the pregnancy. These carry germs that can cause birth defects in the baby.   The first trimester is a good time to visit your dentist for your dental health. Getting your teeth cleaned is OK. Use a softer toothbrush and brush  gently during pregnancy.   Ask for help if you have financial, counseling or nutritional needs during pregnancy. Your caregiver will be able to offer counseling for these needs as well as refer you for other special needs.   Do not take any medications or herbs unless told by your caregiver.   Inform your caregiver if there is any mental or physical domestic violence.   Make a list of emergency phone numbers of family, friends, hospital, and police and fire departments.   Write down your questions. Take them to your prenatal visit.   Do not douche.   Do not cross your legs.   If you have to stand for long periods of time, rotate you feet or take small steps in a circle.   You may have more vaginal secretions that may   require a sanitary pad. Do not use tampons or scented sanitary pads.  MEDICATIONS AND DRUG USE IN PREGNANCY   Take prenatal vitamins as directed. The vitamin should contain 1 milligram of folic acid. Keep all vitamins out of reach of children. Only a couple vitamins or tablets containing iron may be fatal to a baby or young child when ingested.   Avoid use of all medications, including herbs, over-the-counter medications, not prescribed or suggested by your caregiver. Only take over-the-counter or prescription medicines for pain, discomfort, or fever as directed by your caregiver. Do not use aspirin, ibuprofen, or naproxen unless directed by your caregiver.   Let your caregiver also know about herbs you may be using.   Alcohol is related to a number of birth defects. This includes fetal alcohol syndrome. All alcohol, in any form, should be avoided completely. Smoking will cause low birth rate and premature babies.   Street or illegal drugs are very harmful to the baby. They are absolutely forbidden. A baby born to an addicted mother will be addicted at birth. The baby will go through the same withdrawal an adult does.   Let your caregiver know about any medications that you have to take  and for what reason you take them.  MISCARRIAGE IS COMMON DURING PREGNANCY  A miscarriage does not mean you did something wrong. It is not a reason to worry about getting pregnant again. Your caregiver will help you with questions you may have. If you have a miscarriage, you may need minor surgery.  SEEK MEDICAL CARE IF:   You have any concerns or worries during your pregnancy. It is better to call with your questions if you feel they cannot wait, rather than worry about them.  SEEK IMMEDIATE MEDICAL CARE IF:    An unexplained oral temperature above 102 F (38.9 C) develops, or as your caregiver suggests.   You have leaking of fluid from the vagina (birth canal). If leaking membranes are suspected, take your temperature and inform your caregiver of this when you call.   There is vaginal spotting or bleeding. Notify your caregiver of the amount and how many pads are used.   You develop a bad smelling vaginal discharge with a change in the color.   You continue to feel sick to your stomach (nauseated) and have no relief from remedies suggested. You vomit blood or coffee ground-like materials.   You lose more than 2 pounds of weight in 1 week.   You gain more than 2 pounds of weight in 1 week and you notice swelling of your face, hands, feet, or legs.   You gain 5 pounds or more in 1 week (even if you do not have swelling of your hands, face, legs, or feet).   You get exposed to German measles and have never had them.   You are exposed to fifth disease or chickenpox.   You develop belly (abdominal) pain. Round ligament discomfort is a common non-cancerous (benign) cause of abdominal pain in pregnancy. Your caregiver still must evaluate this.   You develop headache, fever, diarrhea, pain with urination, or shortness of breath.   You fall or are in a car accident or have any kind of trauma.   There is mental or physical violence in your home.  Document Released: 11/15/2001 Document Revised: 11/10/2011  Document Reviewed: 05/19/2009  ExitCare Patient Information 2012 [employer].

## 2012-04-03 MED ORDER — CEPHALEXIN 500 MG PO CAPS: 500.0000 mg | ORAL_CAPSULE | Freq: Four times a day (QID) | ORAL | Status: AC

## 2012-04-03 NOTE — Progress Notes (Signed)
Addended by: Catalina Antigua on: 04/03/2012 08:32 AM   Modules accepted: Orders

## 2012-04-05 ENCOUNTER — Telehealth: Payer: Self-pay | Admitting: Gynecology

## 2012-04-05 NOTE — Telephone Encounter (Signed)
Message copied by [name] on Thu Apr 05, 2012 10:57 AM ------      Message from: Robin Abbott      Created: Tue Apr 03, 2012  8:32 AM       Please inform patient that keflex has been e-prescribed to treat a urinary tract infection            Peggy

## 2012-04-06 ENCOUNTER — Ambulatory Visit (HOSPITAL_COMMUNITY): Payer: Self-pay

## 2012-04-06 ENCOUNTER — Ambulatory Visit (HOSPITAL_COMMUNITY)
Admission: RE | Admit: 2012-04-06 | Discharge: 2012-04-06 | Disposition: A | Discharge status: Home / Self Care | Payer: Medicaid Other | Source: Ambulatory Visit | Attending: Obstetrics and Gynecology | Admitting: Obstetrics and Gynecology

## 2012-04-06 DIAGNOSIS — O3440 Maternal care for other abnormalities of cervix, unspecified trimester: Secondary | ICD-10-CM | POA: Insufficient documentation

## 2012-04-06 DIAGNOSIS — O09299 Supervision of pregnancy with other poor reproductive or obstetric history, unspecified trimester: Secondary | ICD-10-CM | POA: Insufficient documentation

## 2012-04-06 DIAGNOSIS — O3680X0 Pregnancy with inconclusive fetal viability, not applicable or unspecified: Secondary | ICD-10-CM | POA: Insufficient documentation

## 2012-04-11 ENCOUNTER — Other Ambulatory Visit: Payer: Self-pay | Admitting: Obstetrics & Gynecology

## 2012-04-11 DIAGNOSIS — Z3682 Encounter for antenatal screening for nuchal translucency: Secondary | ICD-10-CM

## 2012-04-16 ENCOUNTER — Encounter (HOSPITAL_COMMUNITY): Payer: Self-pay

## 2012-04-16 ENCOUNTER — Emergency Department (INDEPENDENT_AMBULATORY_CARE_PROVIDER_SITE_OTHER)
Admission: EM | Admit: 2012-04-16 | Discharge: 2012-04-16 | Disposition: A | Discharge status: Home / Self Care | Payer: Medicaid Other | Source: Home / Self Care | Attending: Emergency Medicine | Admitting: Emergency Medicine

## 2012-04-16 DIAGNOSIS — J069 Acute upper respiratory infection, unspecified: Secondary | ICD-10-CM

## 2012-04-16 NOTE — Discharge Instructions (Signed)
Viral illnessUpper Respiratory Infection, Adult An upper respiratory infection (URI) is also known as the common cold. It is often caused by a type of germ (virus). Colds are easily spread (contagious). You can pass it to others by kissing, coughing, sneezing, or drinking out of the same [place]. Usually, you get better in 1 or 2 weeks.  HOME CARE   Only take medicine as told by your doctor.   Use a warm mist humidifier or breathe in steam from a hot shower.   Drink enough water and fluids to keep your pee (urine) clear or pale yellow.   Get plenty of rest.   Return to work when your temperature is back to normal or as told by your doctor. You may use a face mask and wash your hands to stop your cold from spreading.  GET HELP RIGHT AWAY IF:   After the first few days, you feel you are getting worse.   You have questions about your medicine.   You have chills, shortness of breath, or brown or red spit (mucus).   You have yellow or brown snot (nasal discharge) or pain in the face, especially when you bend forward.   You have a fever, puffy (swollen) neck, pain when you swallow, or white spots in the back of your throat.   You have a bad headache, ear pain, sinus pain, or chest pain.   You have a high-pitched whistling sound when you breathe in and out (wheezing).   You have a lasting cough or cough up blood.   You have sore muscles or a stiff neck.  MAKE SURE YOU:   Understand these instructions.   Will watch your condition.   Will get help right away if you are not doing well or get worse.  Document Released: 05/09/2008 Document Revised: 11/10/2011 Document Reviewed: 03/28/2011 ExitCare Patient Information 2012 [employer], LLCMedicines During Pregnancy Medications should be avoided during the first 3 months of pregnancy unless they are essential to the health of the mother and baby. Some medicines have small risks when taken at certain times in pregnancy. Birth control pills in  very early pregnancy do not cause fetal damage.  Certain drugs cause problems in babies if taken during pregnancy. These should be avoided completely. Talk to your caregiver before taking any medicines (see below).  Female hormones.   Female sex hormones.   Some antibiotics.   Seizure medicines.   Tranquilizers, sleeping pills and antidepressants.   Cortisone-like drugs.   Migraine prevention drugs.   Anti-inflammatories (Aspirin, Motrin and Advil).   Any over-the-counter medications.   Herbal medications.   Water pills and some blood pressure medications.   Smoking can also interfere with normal development. This also causes a higher incidence of drug use in the adolescent years.   Lithium.   Cancer treatments and drugs.   Drugs used to treat an overactive thyroid.   Medications to prevent rejection after a transplant.   Medications used to treat autoimmune disorders.   Alcohol and illicit drugs such as cocaine, heroin and speed.  Check with your caregiver if you want to know more about taking certain drugs while pregnant.  Folate (400 micrograms per day), calcium and iron supplements are needed in all pregnancies. Take all prenatal vitamins as directed. If iron is constipating or upsets the stomach, eat right before you take the vitamin. Ask your caregiver what type of stool softener they recommend. Document Released: 11/21/2005 Document Revised: 11/10/2011 Document Reviewed: 11/04/2011 ExitCare Patient Information 2012  [employer].Marland Kitchen

## 2012-04-16 NOTE — ED Notes (Signed)
On amoxicillin for UTI, day #10; has been having pain w swallowing since this AM; NAD; PREGNANT

## 2012-04-16 NOTE — ED Provider Notes (Signed)
History     CSN: [id number]  Arrival date & time 04/16/12  1233   First MD Initiated Contact with Patient 04/16/12 1357      Chief Complaint  Patient presents with  . Sore Throat    (Consider location/radiation/quality/duration/timing/severity/associated sxs/prior treatment) Patient is a 31 y.o. female presenting with cough. The history is provided by the patient. No language interpreter was used.  Cough This is a new problem. The current episode started more than 1 week ago. The problem occurs constantly. The problem has been gradually worsening. The cough is non-productive. There has been no fever. Associated symptoms include rhinorrhea and sore throat. The treatment provided no relief. She is not a smoker.  Pt is 10 weeks pregnnat recently finished amox.  Pt complains of continued head congestion and pain in throat.  Past Medical History  Diagnosis Date  . Hypertension in pregnancy 2009  . Hx MRSA infection 2010    tested last year for mrsa and it was negative.  . Crohn's disease   . Asthma   . Pregnancy induced hypertension   . Anxiety   . Abnormal Pap smear     at age 25    Past Surgical History  Procedure Date  . Ankle surgery   . Tonsillectomy   . Colposcopy     abnormal pap  . Wisdom tooth extraction     x 4    Family History  Problem Relation Age of Onset  . Diabetes Mother   . Hypertension Mother   . COPD Mother   . Depression Mother   . Anesthesia problems Neg Hx   . Hypertension Father   . Sleep apnea Father   . Diabetes Sister   . Heart disease Paternal Uncle   . Diabetes Maternal Grandmother   . Diabetes Maternal Grandfather     History  Substance Use Topics  . Smoking status: Former Games developer  . Smokeless tobacco: Not on file  . Alcohol Use: No    OB History    Grav Para Term Preterm Abortions TAB SAB Ect Mult Living   3 1 1  0 1 0 1 0 0 1      Review of Systems  HENT: Positive for sore throat and rhinorrhea.   Respiratory: Positive  for cough.   All other systems reviewed and are negative.    Allergies  Hydrocodone and Tetracyclines & related  Home Medications   Current Outpatient Rx  Name Route Sig Dispense Refill  . ACETAMINOPHEN 500 MG PO TABS Oral Take 1,000 mg by mouth every 6 (six) hours as needed. For pain    . ALBUTEROL SULFATE HFA 108 (90 BASE) MCG/ACT IN AERS Inhalation Inhale 1-2 puffs into the lungs every 6 (six) hours as needed for wheezing. 1 Inhaler 0  . AMOXICILLIN 500 MG PO CAPS Oral Take 500 mg by mouth 3 (three) times daily.    Marland Kitchen ONDANSETRON 4 MG PO TBDP Oral Take 4 mg by mouth every 8 (eight) hours as needed.    Marland Kitchen PRENATAL VITAMINS 28-0.8 MG PO TABS Oral Take 1 tablet by mouth daily. 30 tablet 5    BP 125/59  Pulse 72  Temp(Src) 97.9 F (36.6 C) (Oral)  Resp 16  SpO2 100%  LMP 02/09/2012  Physical Exam  Nursing note and vitals reviewed. Constitutional: She appears well-developed and well-nourished.  HENT:  Head: Normocephalic and atraumatic.  Right Ear: External ear normal.  Left Ear: External ear normal.  Nose: Nose normal.  Mouth/Throat: Oropharynx  is clear and moist.  Eyes: Conjunctivae and EOM are normal. Pupils are equal, round, and reactive to light.  Neck: Normal range of motion. Neck supple.  Cardiovascular: Normal rate.   Pulmonary/Chest: Effort normal.  Abdominal: Soft.  Musculoskeletal: Normal range of motion.  Neurological: She is alert.  Skin: Skin is warm.    ED Course  Procedures (including critical care time)  Labs Reviewed - No data to display No results found.   No diagnosis found.    MDM  Pt advised tylenol,          Lonia Skinner [place], [place] 04/16/12 1444

## 2012-04-17 ENCOUNTER — Ambulatory Visit (HOSPITAL_COMMUNITY): Payer: Self-pay

## 2012-04-20 ENCOUNTER — Encounter (HOSPITAL_COMMUNITY): Payer: Self-pay | Admitting: *Deleted

## 2012-04-20 ENCOUNTER — Inpatient Hospital Stay (HOSPITAL_COMMUNITY)
Admission: AD | Admit: 2012-04-20 | Discharge: 2012-04-20 | Disposition: A | Discharge status: Home / Self Care | Payer: Medicaid Other | Source: Ambulatory Visit | Attending: Obstetrics & Gynecology | Admitting: Obstetrics & Gynecology

## 2012-04-20 DIAGNOSIS — O2341 Unspecified infection of urinary tract in pregnancy, first trimester: Secondary | ICD-10-CM

## 2012-04-20 DIAGNOSIS — N390 Urinary tract infection, site not specified: Secondary | ICD-10-CM | POA: Insufficient documentation

## 2012-04-20 DIAGNOSIS — O209 Hemorrhage in early pregnancy, unspecified: Secondary | ICD-10-CM

## 2012-04-20 DIAGNOSIS — O99891 Other specified diseases and conditions complicating pregnancy: Secondary | ICD-10-CM

## 2012-04-20 DIAGNOSIS — O2390 Unspecified genitourinary tract infection in pregnancy, unspecified trimester: Secondary | ICD-10-CM | POA: Insufficient documentation

## 2012-04-20 LAB — WET PREP, GENITAL
Trich, Wet Prep: NONE SEEN
Yeast Wet Prep HPF POC: NONE SEEN

## 2012-04-20 LAB — URINALYSIS, ROUTINE W REFLEX MICROSCOPIC
Glucose, UA: NEGATIVE mg/dL
Hgb urine dipstick: NEGATIVE
Leukocytes, UA: NEGATIVE
Specific Gravity, Urine: 1.025 (ref 1.005–1.030)

## 2012-04-20 LAB — URINE MICROSCOPIC-ADD ON

## 2012-04-20 NOTE — MAU Provider Note (Signed)
Robin Abbott [id number] @[redacted]w[redacted]d  by LMP Chief Complaint  Patient presents with  . Abdominal Pain  . Vaginal Discharge     First Provider Initiated Contact with Patient 04/20/12 1737      SUBJECTIVE  HPI: Pt reports small amount of brown discharge x 1 days and denies abd pain, VB or passage of tissue. She has a verified IUP.  Denies UTI Sx. Completed Amox for E.Coli UTI w/in last week.  Past Medical History  Diagnosis Date  . Hypertension in pregnancy 2009  . Hx MRSA infection 2010    tested last year for mrsa and it was negative.  . Crohn's disease   . Asthma   . Pregnancy induced hypertension   . Anxiety   . Abnormal Pap smear     at age 58   Past Surgical History  Procedure Date  . Ankle surgery   . Tonsillectomy   . Colposcopy     abnormal pap  . Wisdom tooth extraction     x 4   History   Social History  . Marital Status: Married    Spouse Name: N/A    Number of Children: N/A  . Years of Education: N/A   Occupational History  . Not on file.   Social History Main Topics  . Smoking status: Former Smoker    Quit date: 03/05/2012  . Smokeless tobacco: Never Used  . Alcohol Use: No  . Drug Use: No  . Sexually Active: Yes    Birth Control/ Protection: None   Other Topics Concern  . Not on file   Social History Narrative  . No narrative on file   No current facility-administered medications on file prior to encounter.   Current Outpatient Prescriptions on File Prior to Encounter  Medication Sig Dispense Refill  . acetaminophen (TYLENOL) 500 MG tablet Take 1,000 mg by mouth every 6 (six) hours as needed. For pain      . albuterol (PROVENTIL HFA;VENTOLIN HFA) 108 (90 BASE) MCG/ACT inhaler Inhale 1-2 puffs into the lungs every 6 (six) hours as needed for wheezing.  1 Inhaler  0  . amoxicillin (AMOXIL) 500 MG capsule Take 500 mg by mouth 3 (three) times daily. Pt does not know exact date started, was for 10d Tx. Completed 04/18/2012      . ondansetron  (ZOFRAN-ODT) 4 MG disintegrating tablet Take 4 mg by mouth every 8 (eight) hours as needed. nausea      . Prenatal Vit-Fe Fumarate-FA (PRENATAL VITAMINS) 28-0.8 MG TABS Take 1 tablet by mouth daily.  30 tablet  5   Allergies  Allergen Reactions  . Hydrocodone Hives  . Tetracyclines & Related Nausea And Vomiting    ROS: Pertinent items in HPI  OBJECTIVE Blood pressure 137/76, pulse 74, temperature 99.9 F (37.7 C), temperature source Oral, resp. rate 16, height 5\' 4"  (1.626 m), weight 95.528 kg (210 lb 9.6 oz), last menstrual period 02/09/2012, SpO2 100.00%.  GENERAL: Well-developed, well-nourished female in no acute distress.  HEENT: Normocephalic HEART: normal rate RESP: normal effort ABDOMEN: Soft, nontender EXTREMITIES: Nontender, no edema NEURO: Alert and oriented SPECULUM EXAM: physiologic discharge, no blood noted, cervix clean BIMANUAL: cervix closed; uterus difficult to assess due to maternal body habitus, NT; no adnexal tenderness or masses +FHTs by informal BS [place]  LAB RESULTS Results for orders placed during the hospital encounter of 04/20/12 (from the past 72 hour(s))  URINALYSIS, ROUTINE W REFLEX MICROSCOPIC     Status: Abnormal   Collection Time  04/20/12  3:50 PM      Component Value Range Comment   Color, Urine YELLOW  YELLOW     APPearance HAZY (*) CLEAR     Specific Gravity, Urine 1.025  1.005 - 1.030     pH 6.0  5.0 - 8.0     Glucose, UA NEGATIVE  NEGATIVE (mg/dL)    Hgb urine dipstick NEGATIVE  NEGATIVE     Bilirubin Urine NEGATIVE  NEGATIVE     Ketones, ur NEGATIVE  NEGATIVE (mg/dL)    Protein, ur NEGATIVE  NEGATIVE (mg/dL)    Urobilinogen, UA 0.2  0.0 - 1.0 (mg/dL)    Nitrite POSITIVE (*) NEGATIVE     Leukocytes, UA NEGATIVE  NEGATIVE    URINE MICROSCOPIC-ADD ON     Status: Abnormal   Collection Time   04/20/12  3:50 PM      Component Value Range Comment   Squamous Epithelial / LPF FEW (*) RARE     WBC, UA 3-6  <3 (WBC/hpf)    Bacteria, UA MANY  (*) RARE     Crystals CA OXALATE CRYSTALS (*) NEGATIVE    WET PREP, GENITAL     Status: Abnormal   Collection Time   04/20/12  5:50 PM      Component Value Range Comment   Yeast Wet Prep HPF POC NONE SEEN  NONE SEEN     Trich, Wet Prep NONE SEEN  NONE SEEN     Clue Cells Wet Prep HPF POC NONE SEEN  NONE SEEN     WBC, Wet Prep HPF POC FEW (*) NONE SEEN  MANY BACTERIA SEEN   IMAGING   ASSESSMENT 1. First trimester bleeding   2. UTI (urinary tract infection) during pregnancy, first trimester   3.  Asymptomatic bacteriuria, antepartum PLAN D/C home SAB precautions Urine Culture. No new ABX per consult w/ Dr. Shawnie Pons Pelvic rest x 1 week Medication List  As of 04/23/2012  2:38 PM   CONTINUE taking these medications         acetaminophen 500 MG tablet   Commonly known as: TYLENOL      ADVIL COLD/SINUS 30-200 MG Tabs   Generic drug: Pseudoephedrine-Ibuprofen      albuterol 108 (90 BASE) MCG/ACT inhaler   Commonly known as: PROVENTIL HFA;VENTOLIN HFA   Inhale 1-2 puffs into the lungs every 6 (six) hours as needed for wheezing.      amoxicillin 500 MG capsule   Commonly known as: AMOXIL      ondansetron 4 MG disintegrating tablet   Commonly known as: ZOFRAN-ODT      Prenatal Vitamins 28-0.8 MG Tabs   Take 1 tablet by mouth daily.           Follow-up Information    Follow up with [facility] (as scheduled or MAU as needed if symptoms worsen)    Contact information:   [address] [place] [address] [phone]         Dorathy Kinsman 04/20/2012 6:02 PM

## 2012-04-20 NOTE — MAU Note (Signed)
Patient states she has brown spotting on tissue with wiping after urinating. Has left lower abdominal discomfort that comes and goes. Patient is not wearing a pad at this time.

## 2012-04-20 NOTE — MAU Note (Signed)
Dorathy Kinsman CNM at bedside for portable MAU [place] for viability. Positive FHT noted.

## 2012-04-20 NOTE — Discharge Instructions (Signed)
Vaginal Bleeding During Pregnancy, First Trimester A small amount of bleeding (spotting) is relatively common in early pregnancy. It usually stops on its own. There are many causes for bleeding or spotting in early pregnancy. Some bleeding may be related to the pregnancy and some may not. Cramping with the bleeding is more serious and concerning. Tell your caregiver if you have any vaginal bleeding.  CAUSES   It is normal in most cases.   The pregnancy ends (miscarriage).   The pregnancy may end (threatened miscarriage).   Infection or inflammation of the cervix.   Growths (polyps) on the cervix.   Pregnancy happens outside of the uterus and in a fallopian tube (tubal pregnancy).   Many tiny cysts in the uterus instead of pregnancy tissue (molar pregnancy).  SYMPTOMS  Vaginal bleeding or spotting with or without cramps. DIAGNOSIS  To evaluate the pregnancy, your caregiver may:  Do a pelvic exam.   Take blood tests.   Do an ultrasound.  It is very important to follow your caregiver's instructions.  TREATMENT   Evaluation of the pregnancy with blood tests and ultrasound.   Bed rest (getting up to use the bathroom only).   Rho-gam immunization if the mother is Rh negative and the father is Rh positive.  HOME CARE INSTRUCTIONS   If your caregiver orders bed rest, you may need to make arrangements for the care of other children and for other responsibilities. However, your caregiver may allow you to continue light activity.   Keep track of the number of pads you use each day, how often you change pads and how soaked (saturated) they are. Write this down.   Do not use tampons. Do not douche.   Do not have sexual intercourse or orgasms until approved by your physician.   Save any tissue that you pass for your caregiver to see.   Take medicine for cramps only with your caregiver's permission.   Do not take aspirin because it can make you bleed.  SEEK IMMEDIATE MEDICAL CARE  IF:   You experience severe cramps in your stomach, back or belly (abdomen).   You have an oral temperature above 102 F (38.9 C), not controlled by medicine.   You pass large clots or tissue.   Your bleeding increases or you become light-headed, weak or have fainting episodes.   You develop chills.   You are leaking or have a gush of fluid from your vagina.   You pass out while having a bowel movement. That may mean you have a ruptured tubal pregnancy.  Document Released: 08/31/2005 Document Revised: 11/10/2011 Document Reviewed: 03/12/2009 ExitCare Patient Information 2012 [employer].Vaginal Bleeding During Pregnancy, First Trimester A small amount of bleeding (spotting) is relatively common in early pregnancy. It usually stops on its own. There are many causes for bleeding or spotting in early pregnancy. Some bleeding may be related to the pregnancy and some may not. Cramping with the bleeding is more serious and concerning. Tell your caregiver if you have any vaginal bleeding.  CAUSES   It is normal in most cases.   The pregnancy ends (miscarriage).   The pregnancy may end (threatened miscarriage).   Infection or inflammation of the cervix.   Growths (polyps) on the cervix.   Pregnancy happens outside of the uterus and in a fallopian tube (tubal pregnancy).   Many tiny cysts in the uterus instead of pregnancy tissue (molar pregnancy).  SYMPTOMS  Vaginal bleeding or spotting with or without cramps. DIAGNOSIS  To   evaluate the pregnancy, your caregiver may:  Do a pelvic exam.   Take blood tests.   Do an ultrasound.  It is very important to follow your caregiver's instructions.  TREATMENT   Evaluation of the pregnancy with blood tests and ultrasound.   Bed rest (getting up to use the bathroom only).   Rho-gam immunization if the mother is Rh negative and the father is Rh positive.  HOME CARE INSTRUCTIONS   If your caregiver orders bed rest, you may need to  make arrangements for the care of other children and for other responsibilities. However, your caregiver may allow you to continue light activity.   Keep track of the number of pads you use each day, how often you change pads and how soaked (saturated) they are. Write this down.   Do not use tampons. Do not douche.   Do not have sexual intercourse or orgasms until approved by your physician.   Save any tissue that you pass for your caregiver to see.   Take medicine for cramps only with your caregiver's permission.   Do not take aspirin because it can make you bleed.  SEEK IMMEDIATE MEDICAL CARE IF:   You experience severe cramps in your stomach, back or belly (abdomen).   You have an oral temperature above 102 F (38.9 C), not controlled by medicine.   You pass large clots or tissue.   Your bleeding increases or you become light-headed, weak or have fainting episodes.   You develop chills.   You are leaking or have a gush of fluid from your vagina.   You pass out while having a bowel movement. That may mean you have a ruptured tubal pregnancy.  Document Released: 08/31/2005 Document Revised: 11/10/2011 Document Reviewed: 03/12/2009 ExitCare Patient Information 2012 [employer]. 

## 2012-04-23 NOTE — MAU Provider Note (Signed)
Attestation of Attending Supervision of Advanced Practitioner: Evaluation and management procedures were performed by the OB Fellow/PA/CNM/NP under my supervision and collaboration. Chart reviewed, and agree with management and plan.  Kanylah Muench, M.D. 04/23/2012 5:09 PM   

## 2012-04-26 ENCOUNTER — Other Ambulatory Visit (HOSPITAL_COMMUNITY): Payer: Self-pay | Admitting: Advanced Practice Midwife

## 2012-04-26 ENCOUNTER — Telehealth: Payer: Self-pay

## 2012-04-26 NOTE — Telephone Encounter (Signed)
Patient called requesting a refill on her Zofran she is feeling sick again and requested more medicine. Per Inetta Fermo I called Zofran mg with 1 refill to her pharmacy.

## 2012-05-01 ENCOUNTER — Encounter: Payer: Self-pay | Admitting: Obstetrics & Gynecology

## 2012-05-01 ENCOUNTER — Ambulatory Visit (INDEPENDENT_AMBULATORY_CARE_PROVIDER_SITE_OTHER): Payer: Medicaid Other | Admitting: Obstetrics & Gynecology

## 2012-05-01 VITALS — BP 131/86 | Wt 209.0 lb

## 2012-05-01 DIAGNOSIS — Z3480 Encounter for supervision of other normal pregnancy, unspecified trimester: Secondary | ICD-10-CM

## 2012-05-01 DIAGNOSIS — Z3490 Encounter for supervision of normal pregnancy, unspecified, unspecified trimester: Secondary | ICD-10-CM

## 2012-05-01 NOTE — Progress Notes (Signed)
Routine visit. Nausea improving slightly. We discussed rec weight gain of 20 # total. She has her First screen scheduled for 05-09-12.

## 2012-05-09 ENCOUNTER — Ambulatory Visit (HOSPITAL_COMMUNITY)
Admission: RE | Admit: 2012-05-09 | Discharge: 2012-05-09 | Disposition: A | Discharge status: Home / Self Care | Payer: Medicaid Other | Source: Ambulatory Visit | Attending: Obstetrics & Gynecology | Admitting: Obstetrics & Gynecology

## 2012-05-09 ENCOUNTER — Encounter: Payer: Self-pay | Admitting: Obstetrics & Gynecology

## 2012-05-09 ENCOUNTER — Encounter (HOSPITAL_COMMUNITY): Payer: Self-pay

## 2012-05-09 ENCOUNTER — Other Ambulatory Visit: Payer: Self-pay

## 2012-05-09 VITALS — BP 118/74 | HR 82 | Wt 209.0 lb

## 2012-05-09 DIAGNOSIS — Z1389 Encounter for screening for other disorder: Secondary | ICD-10-CM

## 2012-05-09 DIAGNOSIS — K5090 Crohn's disease, unspecified, without complications: Secondary | ICD-10-CM

## 2012-05-09 DIAGNOSIS — Z3682 Encounter for antenatal screening for nuchal translucency: Secondary | ICD-10-CM

## 2012-05-09 DIAGNOSIS — O3510X0 Maternal care for (suspected) chromosomal abnormality in fetus, unspecified, not applicable or unspecified: Secondary | ICD-10-CM | POA: Insufficient documentation

## 2012-05-09 DIAGNOSIS — Z3689 Encounter for other specified antenatal screening: Secondary | ICD-10-CM | POA: Insufficient documentation

## 2012-05-09 DIAGNOSIS — O09299 Supervision of pregnancy with other poor reproductive or obstetric history, unspecified trimester: Secondary | ICD-10-CM | POA: Insufficient documentation

## 2012-05-09 DIAGNOSIS — O351XX0 Maternal care for (suspected) chromosomal abnormality in fetus, not applicable or unspecified: Secondary | ICD-10-CM | POA: Insufficient documentation

## 2012-05-09 DIAGNOSIS — Z3490 Encounter for supervision of normal pregnancy, unspecified, unspecified trimester: Secondary | ICD-10-CM

## 2012-05-09 DIAGNOSIS — O3440 Maternal care for other abnormalities of cervix, unspecified trimester: Secondary | ICD-10-CM | POA: Insufficient documentation

## 2012-05-09 NOTE — ED Notes (Signed)
Pt denies any problems today.   

## 2012-05-09 NOTE — Progress Notes (Signed)
Patient seen today  for ultrasound appointment.  See full report in [facility].  Alpha Gula, MD  Single IUP at 13 5/7 weeks First trimester screen preformed.  NT of 2.0 mm noted. A nasal bone was visualized.  First trimester screen completed as above.  Recommend follow up ultrasound in 5 weeks for anatomy.

## 2012-05-24 ENCOUNTER — Ambulatory Visit (INDEPENDENT_AMBULATORY_CARE_PROVIDER_SITE_OTHER): Payer: Medicaid Other | Admitting: Obstetrics & Gynecology

## 2012-05-24 VITALS — BP 135/81 | Wt 209.0 lb

## 2012-05-24 DIAGNOSIS — Z3480 Encounter for supervision of other normal pregnancy, unspecified trimester: Secondary | ICD-10-CM

## 2012-05-24 DIAGNOSIS — K5090 Crohn's disease, unspecified, without complications: Secondary | ICD-10-CM

## 2012-05-24 DIAGNOSIS — IMO0002 Reserved for concepts with insufficient information to code with codable children: Secondary | ICD-10-CM

## 2012-05-24 DIAGNOSIS — Z302 Encounter for sterilization: Secondary | ICD-10-CM

## 2012-05-24 DIAGNOSIS — Z3490 Encounter for supervision of normal pregnancy, unspecified, unspecified trimester: Secondary | ICD-10-CM

## 2012-05-24 NOTE — Progress Notes (Signed)
Normal NL and first screen, MSAFP to be drawn today. Will follow up results.  Anatomy scan scheduled 06/12/12 at [facility].  No other complaints or concerns.  Preterm labor precautions reviewed.

## 2012-05-24 NOTE — Patient Instructions (Signed)
Pregnancy - Second Trimester The second trimester of pregnancy (3 to 6 months) is a period of rapid growth for you and your baby. At the end of the sixth month, your baby is about 9 inches long and weighs 1 1/2 pounds. You will begin to feel the baby move between 18 and 20 weeks of the pregnancy. This is called quickening. Weight gain is faster. A clear fluid (colostrum) may leak out of your breasts. You may feel small contractions of the womb (uterus). This is known as false labor or Braxton-Hicks contractions. This is like a practice for labor when the baby is ready to be born. Usually, the problems with morning sickness have usually passed by the end of your first trimester. Some women develop small dark blotches (called cholasma, mask of pregnancy) on their face that usually goes away after the baby is born. Exposure to the sun makes the blotches worse. Acne may also develop in some pregnant women and pregnant women who have acne, may find that it goes away. PRENATAL EXAMS  Blood work may continue to be done during prenatal exams. These tests are done to check on your health and the probable health of your baby. Blood work is used to follow your blood levels (hemoglobin). Anemia (low hemoglobin) is common during pregnancy. Iron and vitamins are given to help prevent this. You will also be checked for diabetes between 24 and 28 weeks of the pregnancy. Some of the previous blood tests may be repeated.   The size of the uterus is measured during each visit. This is to make sure that the baby is continuing to grow properly according to the dates of the pregnancy.   Your blood pressure is checked every prenatal visit. This is to make sure you are not getting toxemia.   Your urine is checked to make sure you do not have an infection, diabetes or protein in the urine.   Your weight is checked often to make sure gains are happening at the suggested rate. This is to ensure that both you and your baby are  growing normally.   Sometimes, an ultrasound is performed to confirm the proper growth and development of the baby. This is a test which bounces harmless sound waves off the baby so your caregiver can more accurately determine due dates.  Sometimes, a specialized test is done on the amniotic fluid surrounding the baby. This test is called an amniocentesis. The amniotic fluid is obtained by sticking a needle into the belly (abdomen). This is done to check the chromosomes in instances where there is a concern about possible genetic problems with the baby. It is also sometimes done near the end of pregnancy if an early delivery is required. In this case, it is done to help make sure the baby's lungs are mature enough for the baby to live outside of the womb. CHANGES OCCURING IN THE SECOND TRIMESTER OF PREGNANCY Your body goes through many changes during pregnancy. They vary from person to person. Talk to your caregiver about changes you notice that you are concerned about.  During the second trimester, you will likely have an increase in your appetite. It is normal to have cravings for certain foods. This varies from person to person and pregnancy to pregnancy.   Your lower abdomen will begin to bulge.   You may have to urinate more often because the uterus and baby are pressing on your bladder. It is also common to get more bladder infections during pregnancy (  pain with urination). You can help this by drinking lots of fluids and emptying your bladder before and after intercourse.   You may begin to get stretch marks on your hips, abdomen, and breasts. These are normal changes in the body during pregnancy. There are no exercises or medications to take that prevent this change.   You may begin to develop swollen and bulging veins (varicose veins) in your legs. Wearing support hose, elevating your feet for 15 minutes, 3 to 4 times a day and limiting salt in your diet helps lessen the problem.    Heartburn may develop as the uterus grows and pushes up against the stomach. Antacids recommended by your caregiver helps with this problem. Also, eating smaller meals 4 to 5 times a day helps.   Constipation can be treated with a stool softener or adding bulk to your diet. Drinking lots of fluids, vegetables, fruits, and whole grains are helpful.   Exercising is also helpful. If you have been very active up until your pregnancy, most of these activities can be continued during your pregnancy. If you have been less active, it is helpful to start an exercise program such as walking.   Hemorrhoids (varicose veins in the rectum) may develop at the end of the second trimester. Warm sitz baths and hemorrhoid cream recommended by your caregiver helps hemorrhoid problems.   Backaches may develop during this time of your pregnancy. Avoid heavy lifting, wear low heal shoes and practice good posture to help with backache problems.   Some pregnant women develop tingling and numbness of their hand and fingers because of swelling and tightening of ligaments in the wrist (carpel tunnel syndrome). This goes away after the baby is born.   As your breasts enlarge, you may have to get a bigger bra. Get a comfortable, cotton, support bra. Do not get a nursing bra until the last month of the pregnancy if you will be nursing the baby.   You may get a dark line from your belly button to the pubic area called the linea nigra.   You may develop rosy cheeks because of increase blood flow to the face.   You may develop spider looking lines of the face, neck, arms and chest. These go away after the baby is born.  HOME CARE INSTRUCTIONS   It is extremely important to avoid all smoking, herbs, alcohol, and unprescribed drugs during your pregnancy. These chemicals affect the formation and growth of the baby. Avoid these chemicals throughout the pregnancy to ensure the delivery of a healthy infant.   Most of your home  care instructions are the same as suggested for the first trimester of your pregnancy. Keep your caregiver's appointments. Follow your caregiver's instructions regarding medication use, exercise and diet.   During pregnancy, you are providing food for you and your baby. Continue to eat regular, well-balanced meals. Choose foods such as meat, fish, milk and other low fat dairy products, vegetables, fruits, and whole-grain breads and cereals. Your caregiver will tell you of the ideal weight gain.   A physical sexual relationship may be continued up until near the end of pregnancy if there are no other problems. Problems could include early (premature) leaking of amniotic fluid from the membranes, vaginal bleeding, abdominal pain, or other medical or pregnancy problems.   Exercise regularly if there are no restrictions. Check with your caregiver if you are unsure of the safety of some of your exercises. The greatest weight gain will occur in the   last 2 trimesters of pregnancy. Exercise will help you:   Control your weight.   Get you in shape for labor and delivery.   Lose weight after you have the baby.   Wear a good support or jogging bra for breast tenderness during pregnancy. This may help if worn during sleep. Pads or tissues may be used in the bra if you are leaking colostrum.   Do not use hot tubs, steam rooms or saunas throughout the pregnancy.   Wear your seat belt at all times when driving. This protects you and your baby if you are in an accident.   Avoid raw meat, uncooked cheese, cat litter boxes and soil used by cats. These carry germs that can cause birth defects in the baby.   The second trimester is also a good time to visit your dentist for your dental health if this has not been done yet. Getting your teeth cleaned is OK. Use a soft toothbrush. Brush gently during pregnancy.   It is easier to loose urine during pregnancy. Tightening up and strengthening the pelvic muscles will  help with this problem. Practice stopping your urination while you are going to the bathroom. These are the same muscles you need to strengthen. It is also the muscles you would use as if you were trying to stop from passing gas. You can practice tightening these muscles up 10 times a set and repeating this about 3 times per day. Once you know what muscles to tighten up, do not perform these exercises during urination. It is more likely to contribute to an infection by backing up the urine.   Ask for help if you have financial, counseling or nutritional needs during pregnancy. Your caregiver will be able to offer counseling for these needs as well as refer you for other special needs.   Your skin may become oily. If so, wash your face with mild soap, use non-greasy moisturizer and oil or cream based makeup.  MEDICATIONS AND DRUG USE IN PREGNANCY  Take prenatal vitamins as directed. The vitamin should contain 1 milligram of folic acid. Keep all vitamins out of reach of children. Only a couple vitamins or tablets containing iron may be fatal to a baby or young child when ingested.   Avoid use of all medications, including herbs, over-the-counter medications, not prescribed or suggested by your caregiver. Only take over-the-counter or prescription medicines for pain, discomfort, or fever as directed by your caregiver. Do not use aspirin.   Let your caregiver also know about herbs you may be using.   Alcohol is related to a number of birth defects. This includes fetal alcohol syndrome. All alcohol, in any form, should be avoided completely. Smoking will cause low birth rate and premature babies.   Street or illegal drugs are very harmful to the baby. They are absolutely forbidden. A baby born to an addicted mother will be addicted at birth. The baby will go through the same withdrawal an adult does.  SEEK MEDICAL CARE IF:  You have any concerns or worries during your pregnancy. It is better to call with  your questions if you feel they cannot wait, rather than worry about them. SEEK IMMEDIATE MEDICAL CARE IF:   An unexplained oral temperature above 102 F (38.9 C) develops, or as your caregiver suggests.   You have leaking of fluid from the vagina (birth canal). If leaking membranes are suspected, take your temperature and tell your caregiver of this when you call.   There   is vaginal spotting, bleeding, or passing clots. Tell your caregiver of the amount and how many pads are used. Light spotting in pregnancy is common, especially following intercourse.   You develop a bad smelling vaginal discharge with a change in the color from clear to white.   You continue to feel sick to your stomach (nauseated) and have no relief from remedies suggested. You vomit blood or coffee ground-like materials.   You lose more than 2 pounds of weight or gain more than 2 pounds of weight over 1 week, or as suggested by your caregiver.   You notice swelling of your face, hands, feet, or legs.   You get exposed to German measles and have never had them.   You are exposed to fifth disease or chickenpox.   You develop belly (abdominal) pain. Round ligament discomfort is a common non-cancerous (benign) cause of abdominal pain in pregnancy. Your caregiver still must evaluate you.   You develop a bad headache that does not go away.   You develop fever, diarrhea, pain with urination, or shortness of breath.   You develop visual problems, blurry, or double vision.   You fall or are in a car accident or any kind of trauma.   There is mental or physical violence at home.  Document Released: 11/15/2001 Document Revised: 11/10/2011 Document Reviewed: 05/20/2009 Exit[employer] Patient Information 2012 [employer]. 

## 2012-05-25 LAB — ALPHA FETOPROTEIN, MATERNAL
AFP: 38.1 IU/mL
Curr Gest Age: 15 wks.days
MoM for AFP: 1.85
Open Spina bifida: NEGATIVE
Osb Risk: 1:1090 {titer}

## 2012-05-29 ENCOUNTER — Encounter: Payer: Self-pay | Admitting: Obstetrics & Gynecology

## 2012-06-12 ENCOUNTER — Ambulatory Visit (HOSPITAL_COMMUNITY)
Admission: RE | Admit: 2012-06-12 | Discharge: 2012-06-12 | Disposition: A | Discharge status: Home / Self Care | Payer: Medicaid Other | Source: Ambulatory Visit | Attending: Obstetrics & Gynecology | Admitting: Obstetrics & Gynecology

## 2012-06-12 VITALS — BP 125/71 | HR 81 | Wt 207.5 lb

## 2012-06-12 DIAGNOSIS — Z302 Encounter for sterilization: Secondary | ICD-10-CM

## 2012-06-12 DIAGNOSIS — Z1389 Encounter for screening for other disorder: Secondary | ICD-10-CM | POA: Insufficient documentation

## 2012-06-12 DIAGNOSIS — Z363 Encounter for antenatal screening for malformations: Secondary | ICD-10-CM | POA: Insufficient documentation

## 2012-06-12 DIAGNOSIS — O3440 Maternal care for other abnormalities of cervix, unspecified trimester: Secondary | ICD-10-CM | POA: Insufficient documentation

## 2012-06-12 DIAGNOSIS — O358XX0 Maternal care for other (suspected) fetal abnormality and damage, not applicable or unspecified: Secondary | ICD-10-CM | POA: Insufficient documentation

## 2012-06-12 DIAGNOSIS — O09299 Supervision of pregnancy with other poor reproductive or obstetric history, unspecified trimester: Secondary | ICD-10-CM | POA: Insufficient documentation

## 2012-06-12 DIAGNOSIS — Z3490 Encounter for supervision of normal pregnancy, unspecified, unspecified trimester: Secondary | ICD-10-CM

## 2012-06-12 DIAGNOSIS — K5090 Crohn's disease, unspecified, without complications: Secondary | ICD-10-CM

## 2012-06-12 NOTE — Progress Notes (Signed)
Patient seen today  for ultrasound appointment.  See full report in [facility].  Alpha Gula, MD  Single IUP at 18 4/7 weeks Normal detailed fetal anatomy Normal amnioitic fluid volume No markers associated with aneuploidy were noted   Recommend follow up ultrasound in 6 weeks for interval growth.

## 2012-06-13 ENCOUNTER — Emergency Department (HOSPITAL_COMMUNITY)
Admission: EM | Admit: 2012-06-13 | Discharge: 2012-06-13 | Disposition: A | Discharge status: Home / Self Care | Payer: Medicaid Other | Attending: Emergency Medicine | Admitting: Emergency Medicine

## 2012-06-13 ENCOUNTER — Encounter (HOSPITAL_COMMUNITY): Payer: Self-pay | Admitting: *Deleted

## 2012-06-13 DIAGNOSIS — K5090 Crohn's disease, unspecified, without complications: Secondary | ICD-10-CM | POA: Insufficient documentation

## 2012-06-13 DIAGNOSIS — IMO0002 Reserved for concepts with insufficient information to code with codable children: Secondary | ICD-10-CM | POA: Insufficient documentation

## 2012-06-13 DIAGNOSIS — Z8614 Personal history of Methicillin resistant Staphylococcus aureus infection: Secondary | ICD-10-CM | POA: Insufficient documentation

## 2012-06-13 DIAGNOSIS — J45909 Unspecified asthma, uncomplicated: Secondary | ICD-10-CM | POA: Insufficient documentation

## 2012-06-13 DIAGNOSIS — T169XXA Foreign body in ear, unspecified ear, initial encounter: Secondary | ICD-10-CM | POA: Insufficient documentation

## 2012-06-13 DIAGNOSIS — Z87891 Personal history of nicotine dependence: Secondary | ICD-10-CM | POA: Insufficient documentation

## 2012-06-13 NOTE — ED Notes (Signed)
Pt states woke up with possible bug in her right ear

## 2012-06-13 NOTE — ED Provider Notes (Signed)
History     CSN: [id number]  Arrival date & time 06/13/12  4098   First MD Initiated Contact with Patient 06/13/12 [phone]      Chief Complaint  Patient presents with  . Foreign Body in Ear    (Consider location/radiation/quality/duration/timing/severity/associated sxs/prior treatment) HPI Comments: Patient complains of acute onset of feeling of a foreign body in her ear which approximately 30 minutes prior to arrival. She states that she thought was a bug going around in her ear, this improved significantly just prior to arrival. She denies dizziness nausea vomiting or any other complaints  Patient is a 31 y.o. female presenting with foreign body in ear. The history is provided by the patient and the spouse.  Foreign Body in Ear    Past Medical History  Diagnosis Date  . Hypertension in pregnancy 2009  . Hx MRSA infection 2010    tested last year for mrsa and it was negative.  . Crohn's disease   . Asthma   . Pregnancy induced hypertension   . Anxiety   . Abnormal Pap smear     at age 45    Past Surgical History  Procedure Date  . Ankle surgery   . Tonsillectomy   . Colposcopy     abnormal pap  . Wisdom tooth extraction     x 4    Family History  Problem Relation Age of Onset  . Diabetes Mother   . Hypertension Mother   . COPD Mother   . Depression Mother   . Anesthesia problems Mother   . Hypertension Father   . Sleep apnea Father   . Diabetes Sister   . Heart disease Paternal Uncle   . Diabetes Maternal Grandmother   . Diabetes Maternal Grandfather     History  Substance Use Topics  . Smoking status: Former Smoker    Quit date: 03/05/2012  . Smokeless tobacco: Never Used  . Alcohol Use: No    OB History    Grav Para Term Preterm Abortions TAB SAB Ect Mult Living   3 1 1  0 1 0 1 0 0 1      Review of Systems  Constitutional: Negative for fever.  HENT: Positive for ear pain. Negative for tinnitus and ear discharge.     Allergies    Hydrocodone and Tetracyclines & related  Home Medications   Current Outpatient Rx  Name Route Sig Dispense Refill  . ACETAMINOPHEN 500 MG PO TABS Oral Take 1,000 mg by mouth every 6 (six) hours as needed. For pain    . ALBUTEROL SULFATE HFA 108 (90 BASE) MCG/ACT IN AERS Inhalation Inhale 1-2 puffs into the lungs every 6 (six) hours as needed for wheezing. 1 Inhaler 0  . AMOXICILLIN 500 MG PO CAPS Oral Take 500 mg by mouth 3 (three) times daily. Pt does not know exact date started, was for 10d Tx. Completed 04/18/2012    . ONDANSETRON 4 MG PO TBDP Oral Take 4 mg by mouth every 8 (eight) hours as needed. nausea    . PRENATAL VITAMINS 28-0.8 MG PO TABS Oral Take 1 tablet by mouth daily. 30 tablet 5  . PSEUDOEPHEDRINE-IBUPROFEN 30-200 MG PO TABS Oral Take 1 tablet by mouth 2 (two) times daily as needed. Cold symptoms      BP 121/68  Pulse 74  Temp 98.1 F (36.7 C)  Resp 16  SpO2 100%  LMP 02/09/2012  Physical Exam  Nursing note and vitals reviewed. Constitutional: She appears well-developed and  well-nourished. No distress.  HENT:       Bilateral tympanic membranes visualized without any obstructing foreign bodies, external auditory canals without any foreign bodies or cerumen  Eyes: Conjunctivae are normal. No scleral icterus.    ED Course  Procedures (including critical care time)  Labs Reviewed - No data to display US Ob Detail + 14 Wk  06/12/2012  OBSTETRICAL ULTRASOUND: This exam was performed within a [facility]. The OB US report was generated in the [facility] system, and faxed to the ordering physician.   This report is also available in [employer] and in the [employer]. See [facility] Obstetric US report.     1. Foreign body in ear       MDM  Patient is well-appearing, no foreign body seen at this time, discharged home in good condition.        Vida Roller, MD 06/13/12 [phone]

## 2012-06-19 ENCOUNTER — Ambulatory Visit (HOSPITAL_COMMUNITY): Payer: Medicaid Other

## 2012-06-21 ENCOUNTER — Encounter: Payer: Self-pay | Admitting: Obstetrics & Gynecology

## 2012-06-21 ENCOUNTER — Ambulatory Visit (INDEPENDENT_AMBULATORY_CARE_PROVIDER_SITE_OTHER): Payer: Medicaid Other | Admitting: Obstetrics & Gynecology

## 2012-06-21 VITALS — BP 140/90 | Wt 207.0 lb

## 2012-06-21 DIAGNOSIS — O99210 Obesity complicating pregnancy, unspecified trimester: Secondary | ICD-10-CM | POA: Insufficient documentation

## 2012-06-21 DIAGNOSIS — E669 Obesity, unspecified: Secondary | ICD-10-CM

## 2012-06-21 DIAGNOSIS — Z3490 Encounter for supervision of normal pregnancy, unspecified, unspecified trimester: Secondary | ICD-10-CM

## 2012-06-21 DIAGNOSIS — O139 Gestational [pregnancy-induced] hypertension without significant proteinuria, unspecified trimester: Secondary | ICD-10-CM | POA: Insufficient documentation

## 2012-06-21 DIAGNOSIS — Z3480 Encounter for supervision of other normal pregnancy, unspecified trimester: Secondary | ICD-10-CM

## 2012-06-21 DIAGNOSIS — K5090 Crohn's disease, unspecified, without complications: Secondary | ICD-10-CM

## 2012-06-21 DIAGNOSIS — IMO0002 Reserved for concepts with insufficient information to code with codable children: Secondary | ICD-10-CM

## 2012-06-21 MED ORDER — METHYLDOPA 250 MG PO TABS: ORAL_TABLET | ORAL | Status: DC

## 2012-06-21 NOTE — Progress Notes (Signed)
Routine visit. Normal u/s this month. PIH noted, will start aldomet 500 mg BID. (She tells me that she was induced for HTN at 37 weeks with her last pregnancy). Serial u/s for growth. Early 1 hour glucola next week with BP check) due to obesity. She denies VB, ROM, CTXs. Some heartburn, and I recommended OTC Zantac.

## 2012-06-21 NOTE — Progress Notes (Signed)
Routine prenatal check.  Complains of increased heartburn, reccomended zantac for relief.

## 2012-06-25 ENCOUNTER — Inpatient Hospital Stay (HOSPITAL_COMMUNITY)
Admission: AD | Admit: 2012-06-25 | Discharge: 2012-06-25 | Disposition: A | Discharge status: Home / Self Care | Payer: Medicaid Other | Source: Ambulatory Visit | Attending: Obstetrics & Gynecology | Admitting: Obstetrics & Gynecology

## 2012-06-25 ENCOUNTER — Telehealth: Payer: Self-pay | Admitting: *Deleted

## 2012-06-25 ENCOUNTER — Encounter (HOSPITAL_COMMUNITY): Payer: Self-pay | Admitting: *Deleted

## 2012-06-25 DIAGNOSIS — G5600 Carpal tunnel syndrome, unspecified upper limb: Secondary | ICD-10-CM

## 2012-06-25 DIAGNOSIS — R1013 Epigastric pain: Secondary | ICD-10-CM | POA: Insufficient documentation

## 2012-06-25 DIAGNOSIS — K219 Gastro-esophageal reflux disease without esophagitis: Secondary | ICD-10-CM

## 2012-06-25 DIAGNOSIS — O99891 Other specified diseases and conditions complicating pregnancy: Secondary | ICD-10-CM | POA: Insufficient documentation

## 2012-06-25 MED ORDER — WRIST BRACE/RIGHT MEDIUM MISC: 1.0000 | Freq: Every evening | Status: DC

## 2012-06-25 MED ORDER — ESOMEPRAZOLE MAGNESIUM 20 MG PO CPDR: 20.0000 mg | DELAYED_RELEASE_CAPSULE | Freq: Every day | ORAL | Status: DC

## 2012-06-25 MED ORDER — WRIST BRACE/LEFT MEDIUM MISC: 1.0000 | Freq: Every evening | Status: DC

## 2012-06-25 MED ORDER — GI COCKTAIL ~~LOC~~
30.0000 mL | Freq: Once | ORAL | Status: AC
  Administered 2012-06-25: 30 mL via ORAL
  Filled 2012-06-25: qty 30

## 2012-06-25 MED ORDER — METOCLOPRAMIDE HCL 5 MG PO TABS: 5.0000 mg | ORAL_TABLET | Freq: Four times a day (QID) | ORAL | Status: DC

## 2012-06-25 NOTE — MAU Provider Note (Signed)
History     CSN: [id number]  Arrival date and time: 06/25/12 1257   First Provider Initiated Contact with Patient 06/25/12 1426      Chief Complaint  Patient presents with  . Chest Pain   HPI This is a 31 y.o. female at [redacted]w[redacted]d who presents with c/o sharp epigastric pain which comes and goes. Takes Zantac at home which helps a little.  No shortness of breath.  Also c/o Hands getting weak over past few days.  States are numb when she wakes up but not numb later on.   RN Note Was started on a new BP medication last Thurs. On Friday started having chest pain. Is a sharp pain ,mid- sternal area, comes and goes. Not related to any activity. Hands have been going numb- has dropped things, difficulty opening things. Feel weak  OB History    Grav Para Term Preterm Abortions TAB SAB Ect Mult Living   3 1 1  0 1 0 1 0 0 1      Past Medical History  Diagnosis Date  . Hypertension in pregnancy 2009  . Hx MRSA infection 2010    tested last year for mrsa and it was negative.  . Crohn's disease   . Asthma   . Pregnancy induced hypertension   . Anxiety   . Abnormal Pap smear     at age 34  . Urinary tract infection     Past Surgical History  Procedure Date  . Ankle surgery   . Tonsillectomy   . Colposcopy     abnormal pap  . Wisdom tooth extraction     x 4    Family History  Problem Relation Age of Onset  . Diabetes Mother   . Hypertension Mother   . COPD Mother   . Depression Mother   . Anesthesia problems Mother   . Other Mother   . Hypertension Father   . Sleep apnea Father   . Other Father   . Diabetes Sister   . Heart disease Paternal Uncle   . Diabetes Maternal Grandmother   . Diabetes Maternal Grandfather     History  Substance Use Topics  . Smoking status: Former Smoker    Quit date: 03/05/2012  . Smokeless tobacco: Never Used  . Alcohol Use: No    Allergies:  Allergies  Allergen Reactions  . Hydrocodone Hives  . Tetracyclines & Related Nausea And  Vomiting    Prescriptions prior to admission  Medication Sig Dispense Refill  . acetaminophen (TYLENOL) 500 MG tablet Take 1,000 mg by mouth every 6 (six) hours as needed. For pain      . albuterol (PROVENTIL HFA;VENTOLIN HFA) 108 (90 BASE) MCG/ACT inhaler Inhale 1-2 puffs into the lungs every 6 (six) hours as needed for wheezing.  1 Inhaler  0  . calcium carbonate (TUMS - DOSED IN MG ELEMENTAL CALCIUM) 500 MG chewable tablet Chew 1 tablet by mouth daily.      . methyldopa (ALDOMET) 250 MG tablet Take 1 pill BId  60 tablet  12  . ondansetron (ZOFRAN-ODT) 4 MG disintegrating tablet Take 4 mg by mouth every 8 (eight) hours as needed. nausea      . Prenatal Vit-Fe Fumarate-FA (PRENATAL VITAMINS) 28-0.8 MG TABS Take 1 tablet by mouth daily.  30 tablet  5  . Pseudoephedrine-Ibuprofen (ADVIL COLD/SINUS) 30-200 MG TABS Take 1 tablet by mouth 2 (two) times daily as needed. Cold symptoms        ROS As listed  above  Physical Exam   Blood pressure 132/77, pulse 90, temperature 99.7 F (37.6 C), temperature source Oral, resp. rate 18, last menstrual period 02/09/2012, SpO2 100.00%.  Physical Exam  Constitutional: She is oriented to person, place, and time. She appears well-developed and well-nourished. No distress.  Cardiovascular: Normal rate, regular rhythm and normal heart sounds.  Exam reveals no gallop and no friction rub.   No murmur heard. Respiratory: Effort normal.  GI: Soft. She exhibits no distension and no mass. There is no tenderness. There is no rebound and no guarding.  Genitourinary: Vagina normal and uterus normal. No vaginal discharge found.  Musculoskeletal: Normal range of motion.  Neurological: She is alert and oriented to person, place, and time.  Skin: Skin is warm and dry.  Psychiatric: She has a normal mood and affect.   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 Does retain food in stomach after eating.   Assessment and Plan  A:  SIUP at [redacted]w[redacted]d       GERD      Bilateral carpal tunnel syndrome      No evidence of PTL  P:  Will try Reglan for improving gastric emptying      Rx Nexium for better control of GERD, stop Zantac      Rx Bilateral wrist splints  WILLIAMS,MARIE 06/25/2012, 2:27 PM

## 2012-06-25 NOTE — MAU Note (Signed)
EKG here

## 2012-06-25 NOTE — Telephone Encounter (Signed)
Patient called stating that she was put on Blood pressure medication last Thursday and has been experiencing chest pains and weakness in her hands for the past two days.  The chest pain comes and goes.  I have advised patient to be seen in the ER, she will have her father go with her.

## 2012-06-25 NOTE — MAU Note (Addendum)
Was started on a new BP medication last Thurs.  On Friday started having chest pain.  Is a sharp pain ,mid- sternal area, comes and goes. Not related to any activity.  Hands have been going numb- has dropped things, difficulty opening things.  Feel weak

## 2012-06-26 NOTE — MAU Provider Note (Signed)
Attestation of Attending Supervision of Advanced Practitioner (CNM/NP): Evaluation and management procedures were performed by the Advanced Practitioner under my supervision and collaboration.  I have reviewed the Advanced Practitioner's note and chart, and I agree with the management and plan.  HARRAWAY-SMITH, Jalaiyah Throgmorton 4:42 PM     

## 2012-06-29 ENCOUNTER — Encounter: Payer: Self-pay | Admitting: Obstetrics & Gynecology

## 2012-06-29 ENCOUNTER — Ambulatory Visit (INDEPENDENT_AMBULATORY_CARE_PROVIDER_SITE_OTHER): Payer: Medicaid Other | Admitting: Obstetrics & Gynecology

## 2012-06-29 VITALS — BP 119/67 | Wt 212.5 lb

## 2012-06-29 DIAGNOSIS — Z3480 Encounter for supervision of other normal pregnancy, unspecified trimester: Secondary | ICD-10-CM

## 2012-06-29 DIAGNOSIS — IMO0002 Reserved for concepts with insufficient information to code with codable children: Secondary | ICD-10-CM

## 2012-06-29 DIAGNOSIS — Z3490 Encounter for supervision of normal pregnancy, unspecified, unspecified trimester: Secondary | ICD-10-CM

## 2012-06-29 DIAGNOSIS — Z23 Encounter for immunization: Secondary | ICD-10-CM

## 2012-06-29 DIAGNOSIS — E669 Obesity, unspecified: Secondary | ICD-10-CM

## 2012-06-29 DIAGNOSIS — O139 Gestational [pregnancy-induced] hypertension without significant proteinuria, unspecified trimester: Secondary | ICD-10-CM

## 2012-06-29 DIAGNOSIS — O99210 Obesity complicating pregnancy, unspecified trimester: Secondary | ICD-10-CM

## 2012-06-29 DIAGNOSIS — M5430 Sciatica, unspecified side: Secondary | ICD-10-CM

## 2012-06-29 DIAGNOSIS — K5090 Crohn's disease, unspecified, without complications: Secondary | ICD-10-CM

## 2012-06-29 LAB — CBC
MCHC: 33.7 g/dL (ref 30.0–36.0)
Platelets: 213 10*3/uL (ref 150–400)
RDW: 13.4 % (ref 11.5–15.5)
WBC: 9.1 10*3/uL (ref 4.0–10.5)

## 2012-06-29 LAB — HIV ANTIBODY (ROUTINE TESTING W REFLEX): HIV: NONREACTIVE

## 2012-06-29 NOTE — Progress Notes (Signed)
Routine visit. Complains of Left sciatica. Exercises shown, rec chiropractor. Good FM, no VB, ROM, CTX. Early 1 hour glucola today.

## 2012-06-29 NOTE — Addendum Note (Signed)
Addended by: Allie Bossier on: 06/29/2012 09:36 AM   Modules accepted: Orders

## 2012-06-29 NOTE — Progress Notes (Signed)
Lower abdominal pain that shoots down left leg, usually only when walking or standing not sitting. Doing early 1hr GTT today.

## 2012-06-30 LAB — RPR

## 2012-07-03 NOTE — Addendum Note (Signed)
Addended by: Barbara Cower on: 07/03/2012 11:15 AM   Modules accepted: Orders

## 2012-07-13 ENCOUNTER — Encounter: Payer: Self-pay | Admitting: Obstetrics & Gynecology

## 2012-07-13 ENCOUNTER — Ambulatory Visit (INDEPENDENT_AMBULATORY_CARE_PROVIDER_SITE_OTHER): Payer: Medicaid Other | Admitting: Obstetrics & Gynecology

## 2012-07-13 VITALS — BP 117/69 | Wt 213.0 lb

## 2012-07-13 DIAGNOSIS — O139 Gestational [pregnancy-induced] hypertension without significant proteinuria, unspecified trimester: Secondary | ICD-10-CM

## 2012-07-13 DIAGNOSIS — L293 Anogenital pruritus, unspecified: Secondary | ICD-10-CM

## 2012-07-13 DIAGNOSIS — O99210 Obesity complicating pregnancy, unspecified trimester: Secondary | ICD-10-CM

## 2012-07-13 DIAGNOSIS — Z3490 Encounter for supervision of normal pregnancy, unspecified, unspecified trimester: Secondary | ICD-10-CM

## 2012-07-13 DIAGNOSIS — E669 Obesity, unspecified: Secondary | ICD-10-CM

## 2012-07-13 DIAGNOSIS — N898 Other specified noninflammatory disorders of vagina: Secondary | ICD-10-CM

## 2012-07-13 DIAGNOSIS — Z3480 Encounter for supervision of other normal pregnancy, unspecified trimester: Secondary | ICD-10-CM

## 2012-07-13 DIAGNOSIS — IMO0002 Reserved for concepts with insufficient information to code with codable children: Secondary | ICD-10-CM

## 2012-07-13 NOTE — Progress Notes (Signed)
Routine visit. Complains of a white vaginal discharge. Spec exam normal today. Wet prep sent. She reports good FM, denies VB, ROM, CTXs. Her glucola last week was canceled by the lab so I will get a RBS today and a 1 hour glucola at [facility].

## 2012-07-14 LAB — WET PREP, GENITAL

## 2012-07-24 ENCOUNTER — Ambulatory Visit (HOSPITAL_COMMUNITY)
Admission: RE | Admit: 2012-07-24 | Discharge: 2012-07-24 | Disposition: A | Discharge status: Home / Self Care | Payer: Medicaid Other | Source: Ambulatory Visit | Attending: Obstetrics and Gynecology | Admitting: Obstetrics and Gynecology

## 2012-07-24 ENCOUNTER — Encounter (HOSPITAL_COMMUNITY): Payer: Self-pay

## 2012-07-24 VITALS — BP 128/75 | HR 86 | Wt 213.0 lb

## 2012-07-24 DIAGNOSIS — O99210 Obesity complicating pregnancy, unspecified trimester: Secondary | ICD-10-CM

## 2012-07-24 DIAGNOSIS — Z302 Encounter for sterilization: Secondary | ICD-10-CM

## 2012-07-24 DIAGNOSIS — O3440 Maternal care for other abnormalities of cervix, unspecified trimester: Secondary | ICD-10-CM | POA: Insufficient documentation

## 2012-07-24 DIAGNOSIS — O139 Gestational [pregnancy-induced] hypertension without significant proteinuria, unspecified trimester: Secondary | ICD-10-CM

## 2012-07-24 DIAGNOSIS — Z3490 Encounter for supervision of normal pregnancy, unspecified, unspecified trimester: Secondary | ICD-10-CM

## 2012-07-24 DIAGNOSIS — K5090 Crohn's disease, unspecified, without complications: Secondary | ICD-10-CM

## 2012-07-24 DIAGNOSIS — O09299 Supervision of pregnancy with other poor reproductive or obstetric history, unspecified trimester: Secondary | ICD-10-CM | POA: Insufficient documentation

## 2012-07-24 NOTE — Progress Notes (Signed)
Robin Abbott  was seen today for an ultrasound appointment.  See full report in [facility].  Alpha Gula, MD  Single IUP at 24 4/7 weeks Normal interval anatomy- anatomic survey now complete Interval growth is appropriate (61st %tile) Normal amnioitic fluid volume  Recommend follow up ultrasound in 4 weeks for interval growth due to comorbidities

## 2012-07-27 ENCOUNTER — Inpatient Hospital Stay (HOSPITAL_COMMUNITY)
Admission: AD | Admit: 2012-07-27 | Discharge: 2012-07-27 | Disposition: A | Discharge status: Home / Self Care | Payer: Medicaid Other | Source: Ambulatory Visit | Attending: Obstetrics & Gynecology | Admitting: Obstetrics & Gynecology

## 2012-07-27 ENCOUNTER — Encounter (HOSPITAL_COMMUNITY): Payer: Self-pay | Admitting: *Deleted

## 2012-07-27 DIAGNOSIS — O2340 Unspecified infection of urinary tract in pregnancy, unspecified trimester: Secondary | ICD-10-CM

## 2012-07-27 DIAGNOSIS — O99210 Obesity complicating pregnancy, unspecified trimester: Secondary | ICD-10-CM

## 2012-07-27 DIAGNOSIS — E669 Obesity, unspecified: Secondary | ICD-10-CM

## 2012-07-27 DIAGNOSIS — Z3490 Encounter for supervision of normal pregnancy, unspecified, unspecified trimester: Secondary | ICD-10-CM

## 2012-07-27 DIAGNOSIS — O139 Gestational [pregnancy-induced] hypertension without significant proteinuria, unspecified trimester: Secondary | ICD-10-CM | POA: Insufficient documentation

## 2012-07-27 DIAGNOSIS — Z302 Encounter for sterilization: Secondary | ICD-10-CM

## 2012-07-27 DIAGNOSIS — K5090 Crohn's disease, unspecified, without complications: Secondary | ICD-10-CM

## 2012-07-27 LAB — URINALYSIS, ROUTINE W REFLEX MICROSCOPIC
Bilirubin Urine: NEGATIVE
Glucose, UA: NEGATIVE mg/dL
Hgb urine dipstick: NEGATIVE
Ketones, ur: NEGATIVE mg/dL
Nitrite: POSITIVE — AB
Specific Gravity, Urine: 1.015 (ref 1.005–1.030)
pH: 7 (ref 5.0–8.0)

## 2012-07-27 LAB — URINE MICROSCOPIC-ADD ON

## 2012-07-27 MED ORDER — AMOXICILLIN 500 MG PO CAPS: 500.0000 mg | ORAL_CAPSULE | Freq: Three times a day (TID) | ORAL | Status: AC

## 2012-07-27 NOTE — MAU Note (Signed)
Pt states was dx'd with PIH already, has felt intermittent lightheadedness, however has noted increased swelling in hands, face, and feet. Also has had headache x2 days. Denies RUQ pain.

## 2012-07-27 NOTE — MAU Provider Note (Signed)
History     CSN: [id number]  Arrival date and time: 07/27/12 1248   First Provider Initiated Contact with Patient 07/27/12 1400      Chief Complaint  Patient presents with  . Lightheaded, increased swelling hands and feet   . Headache   HPI Pt is a 31 y.o. G3P1011 at [redacted]w[redacted]d with pregnancy-induced hypertension presenting with mild headache for 3 days and swelling in hands and face since last night. Taking Aldomet for HTN. 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     OB History    Grav Para Term Preterm Abortions TAB SAB Ect Mult Living   3 1 1  0 1 0 1 0 0 1      Past Medical History  Diagnosis Date  . Hypertension in pregnancy 2009  . Hx MRSA infection 2010    tested last year for mrsa and it was negative.  . Crohn's disease   . Asthma   . Pregnancy induced hypertension   . Anxiety   . Abnormal Pap smear     at age 26  . Urinary tract infection     Past Surgical History  Procedure Date  . Ankle surgery   . Tonsillectomy   . Colposcopy     abnormal pap  . Wisdom tooth extraction     x 4  . Knee surgery     Family History  Problem Relation Age of Onset  . Diabetes Mother   . Hypertension Mother   . COPD Mother   . Depression Mother   . Anesthesia problems Mother   . Other Mother   . Hypertension Father   . Sleep apnea Father   . Other Father   . Diabetes Sister   . Heart disease Paternal Uncle   . Diabetes Maternal Grandmother   . Diabetes Maternal Grandfather     History  Substance Use Topics  . Smoking status: Former Smoker    Quit date: 03/05/2012  . Smokeless tobacco: Never Used  . Alcohol Use: No    Allergies:  Allergies  Allergen Reactions  . Hydrocodone Hives  . Tetracyclines & Related Nausea And Vomiting    Prescriptions prior to admission  Medication Sig Dispense Refill  . acetaminophen (TYLENOL) 500 MG tablet Take 1,000 mg by mouth every 6 (six) hours as needed. For pain      . albuterol (PROVENTIL  HFA;VENTOLIN HFA) 108 (90 BASE) MCG/ACT inhaler Inhale 1-2 puffs into the lungs every 6 (six) hours as needed for wheezing.  1 Inhaler  0  . calcium carbonate (TUMS - DOSED IN MG ELEMENTAL CALCIUM) 500 MG chewable tablet Chew 1 tablet by mouth daily.      . methyldopa (ALDOMET) 250 MG tablet Take 250 mg by mouth 2 (two) times daily. Take 1 pill BId      . Prenatal Vit-Fe Fumarate-FA (PRENATAL VITAMINS) 28-0.8 MG TABS Take 1 tablet by mouth daily.  30 tablet  5  . ranitidine (ZANTAC) 75 MG tablet Take 75 mg by mouth AC breakfast.      . DISCONTD: methyldopa (ALDOMET) 250 MG tablet Take 1 pill BId  60 tablet  12  . Elastic Bandages & Supports (WRIST BRACE/LEFT MEDIUM) MISC 1 each by Does not apply route Nightly.  1 each  0  . Elastic Bandages & Supports (WRIST BRACE/RIGHT MEDIUM) MISC 1 each by Does not apply route Nightly.  1 each  0  . esomeprazole (NEXIUM) 20 MG capsule Take 1  capsule (20 mg total) by mouth daily before breakfast.  30 capsule  1    Review of Systems  Constitutional: Negative for fever and chills.  Eyes: Negative for blurred vision, double vision and photophobia.  Respiratory: Negative.   Cardiovascular: Negative.   Gastrointestinal: Negative for heartburn, nausea, vomiting, abdominal pain, diarrhea and constipation.  Genitourinary: Negative for dysuria and urgency.  Musculoskeletal: Negative for back pain.  Neurological: Positive for dizziness (lightheadedness) and headaches.   Physical Exam   Blood pressure 118/67, pulse 88, temperature 98.2 F (36.8 C), temperature source Oral, resp. rate 16, height 5\' 4"  (1.626 m), weight 96.333 kg (212 lb 6 oz), last menstrual period 02/09/2012, SpO2 98.00%.  Physical Exam  Constitutional: She is oriented to person, place, and time. She appears well-developed and well-nourished. No distress.  HENT:  Head: Normocephalic and atraumatic.  Eyes: Conjunctivae and EOM are normal. Pupils are equal, round, and reactive to light.  Neck:  Normal range of motion. Neck supple.  Cardiovascular: Normal rate, regular rhythm and normal heart sounds.   Respiratory: Effort normal and breath sounds normal. No respiratory distress. She has no wheezes. She has no rales.  GI: Soft. Bowel sounds are normal. There is no tenderness. There is no rebound and no guarding.       Gravid, size appr for dates  Musculoskeletal: She exhibits edema (mild lower extremity, non-pitting.). She exhibits no tenderness.  Neurological: She is alert and oriented to person, place, and time.  Skin: Skin is warm and dry.  Psychiatric: She has a normal mood and affect.    Results for orders placed during the hospital encounter of 07/27/12 (from the past 24 hour(s))  URINALYSIS, ROUTINE W REFLEX MICROSCOPIC     Status: Abnormal   Collection Time   07/27/12  1:18 PM      Component Value Range   Color, Urine YELLOW  YELLOW   APPearance CLOUDY (*) CLEAR   Specific Gravity, Urine 1.015  1.005 - 1.030   pH 7.0  5.0 - 8.0   Glucose, UA NEGATIVE  NEGATIVE mg/dL   Hgb urine dipstick NEGATIVE  NEGATIVE   Bilirubin Urine NEGATIVE  NEGATIVE   Ketones, ur NEGATIVE  NEGATIVE mg/dL   Protein, ur NEGATIVE  NEGATIVE mg/dL   Urobilinogen, UA 1.0  0.0 - 1.0 mg/dL   Nitrite POSITIVE (*) NEGATIVE   Leukocytes, UA SMALL (*) NEGATIVE  URINE MICROSCOPIC-ADD ON     Status: Abnormal   Collection Time   07/27/12  1:18 PM      Component Value Range   Squamous Epithelial / LPF FEW (*) RARE   WBC, UA 11-20  <3 WBC/hpf   RBC / HPF 0-2  <3 RBC/hpf   Bacteria, UA MANY (*) RARE   Crystals CA OXALATE CRYSTALS (*) NEGATIVE     MAU Course  Procedures  NST read and interpreted by me.  Baseline 135, mod var, accels present, no decels. Reactive NST  Assessment and Plan  31 y.o. G3P1011 at [redacted]w[redacted]d with swelling hands/face. 1.  NST reactive 2.  Gestational HTN - BP normal. No proteinuria. Continue methyldopa. F/U as scheduled 9/6 in clinic. Labor precautions and signs/sx of  preeclampsia discussed. 3.  Urine nitrites.  Will treat with amoxicillin. Send for culture.   Napoleon Form 07/27/2012, 2:15 PM

## 2012-07-30 LAB — URINE CULTURE

## 2012-08-02 ENCOUNTER — Telehealth: Payer: Self-pay | Admitting: *Deleted

## 2012-08-02 DIAGNOSIS — B373 Candidiasis of vulva and vagina: Secondary | ICD-10-CM

## 2012-08-02 MED ORDER — FLUCONAZOLE 150 MG PO TABS: 150.0000 mg | ORAL_TABLET | Freq: Once | ORAL | Status: AC

## 2012-08-02 NOTE — Telephone Encounter (Signed)
Patient was placed on antibiotics for a urine infection and now she is having itching and burning in her vulva area and feels like she has a yeast infection.  We will call in Diflucan for her and she will call us back if her symptoms are not resolving to see a physician for further evaluation.

## 2012-08-10 ENCOUNTER — Ambulatory Visit (INDEPENDENT_AMBULATORY_CARE_PROVIDER_SITE_OTHER): Payer: Medicaid Other | Admitting: Obstetrics and Gynecology

## 2012-08-10 VITALS — BP 138/74 | Wt 217.0 lb

## 2012-08-10 DIAGNOSIS — IMO0002 Reserved for concepts with insufficient information to code with codable children: Secondary | ICD-10-CM

## 2012-08-10 DIAGNOSIS — O139 Gestational [pregnancy-induced] hypertension without significant proteinuria, unspecified trimester: Secondary | ICD-10-CM

## 2012-08-10 DIAGNOSIS — E669 Obesity, unspecified: Secondary | ICD-10-CM

## 2012-08-10 DIAGNOSIS — Z3490 Encounter for supervision of normal pregnancy, unspecified, unspecified trimester: Secondary | ICD-10-CM

## 2012-08-10 DIAGNOSIS — K5090 Crohn's disease, unspecified, without complications: Secondary | ICD-10-CM

## 2012-08-10 DIAGNOSIS — O99210 Obesity complicating pregnancy, unspecified trimester: Secondary | ICD-10-CM

## 2012-08-10 DIAGNOSIS — Z302 Encounter for sterilization: Secondary | ICD-10-CM

## 2012-08-10 DIAGNOSIS — Z3480 Encounter for supervision of other normal pregnancy, unspecified trimester: Secondary | ICD-10-CM

## 2012-08-10 LAB — CBC
MCH: 31.2 pg (ref 26.0–34.0)
Platelets: 214 10*3/uL (ref 150–400)
RBC: 3.62 MIL/uL — ABNORMAL LOW (ref 3.87–5.11)
RDW: 13.1 % (ref 11.5–15.5)
WBC: 10.3 10*3/uL (ref 4.0–10.5)

## 2012-08-10 LAB — HIV ANTIBODY (ROUTINE TESTING W REFLEX): HIV: NONREACTIVE

## 2012-08-10 NOTE — Progress Notes (Signed)
Patient complaining of increasing pelvic pressure. Cervix closed/long/posterior. 1hr GCT today. F/U ultrasound with MFM 9/17. FM/PTL precautions reviewed

## 2012-08-10 NOTE — Addendum Note (Signed)
Addended by: Vinnie Langton C on: 08/10/2012 10:42 AM   Modules accepted: Orders

## 2012-08-10 NOTE — Progress Notes (Signed)
Doing 1hr today for the second time, lab cancelled first test due to wrong code being entered. Increased pelvic pressure and during times of the pressure/pain the baby stops moving.

## 2012-08-11 ENCOUNTER — Encounter: Payer: Self-pay | Admitting: Obstetrics and Gynecology

## 2012-08-11 LAB — GLUCOSE TOLERANCE, 1 HOUR (50G) W/O FASTING: Glucose, 1 Hour GTT: 179 mg/dL — ABNORMAL HIGH (ref 70–140)

## 2012-08-20 ENCOUNTER — Other Ambulatory Visit: Payer: Medicaid Other | Admitting: *Deleted

## 2012-08-20 DIAGNOSIS — R7309 Other abnormal glucose: Secondary | ICD-10-CM

## 2012-08-21 ENCOUNTER — Encounter (HOSPITAL_COMMUNITY): Payer: Self-pay

## 2012-08-21 ENCOUNTER — Ambulatory Visit (HOSPITAL_COMMUNITY)
Admission: RE | Admit: 2012-08-21 | Discharge: 2012-08-21 | Disposition: A | Discharge status: Home / Self Care | Payer: Medicaid Other | Source: Ambulatory Visit | Attending: Obstetrics and Gynecology | Admitting: Obstetrics and Gynecology

## 2012-08-21 VITALS — BP 137/74 | HR 92 | Wt 218.0 lb

## 2012-08-21 DIAGNOSIS — O09299 Supervision of pregnancy with other poor reproductive or obstetric history, unspecified trimester: Secondary | ICD-10-CM | POA: Insufficient documentation

## 2012-08-21 DIAGNOSIS — Z3490 Encounter for supervision of normal pregnancy, unspecified, unspecified trimester: Secondary | ICD-10-CM

## 2012-08-21 DIAGNOSIS — O3440 Maternal care for other abnormalities of cervix, unspecified trimester: Secondary | ICD-10-CM | POA: Insufficient documentation

## 2012-08-21 DIAGNOSIS — Z302 Encounter for sterilization: Secondary | ICD-10-CM

## 2012-08-21 DIAGNOSIS — O99210 Obesity complicating pregnancy, unspecified trimester: Secondary | ICD-10-CM

## 2012-08-21 DIAGNOSIS — K5090 Crohn's disease, unspecified, without complications: Secondary | ICD-10-CM

## 2012-08-21 DIAGNOSIS — O139 Gestational [pregnancy-induced] hypertension without significant proteinuria, unspecified trimester: Secondary | ICD-10-CM

## 2012-08-21 LAB — GLUCOSE TOLERANCE, 3 HOURS
Glucose Tolerance, 1 hour: 176 mg/dL (ref 70–189)
Glucose Tolerance, 2 hour: 170 mg/dL — ABNORMAL HIGH (ref 70–164)

## 2012-08-22 ENCOUNTER — Telehealth: Payer: Self-pay | Admitting: *Deleted

## 2012-08-22 ENCOUNTER — Encounter: Payer: Self-pay | Admitting: Obstetrics and Gynecology

## 2012-08-22 DIAGNOSIS — O24419 Gestational diabetes mellitus in pregnancy, unspecified control: Secondary | ICD-10-CM | POA: Insufficient documentation

## 2012-08-22 NOTE — Telephone Encounter (Signed)
Patient has been notified of results and need for diabetes education.  Referral was placed in the system and forwarded to Darl Pikes to get her set up.  Patient states her sister has diabetes as well and she is familiar.

## 2012-08-23 ENCOUNTER — Ambulatory Visit (HOSPITAL_COMMUNITY)
Admission: RE | Admit: 2012-08-23 | Discharge: 2012-08-23 | Disposition: A | Discharge status: Home / Self Care | Payer: Medicaid Other | Source: Ambulatory Visit | Attending: Obstetrics and Gynecology | Admitting: Obstetrics and Gynecology

## 2012-08-23 ENCOUNTER — Encounter: Payer: Medicaid Other | Attending: Obstetrics and Gynecology | Admitting: Dietician

## 2012-08-23 DIAGNOSIS — O99810 Abnormal glucose complicating pregnancy: Secondary | ICD-10-CM | POA: Insufficient documentation

## 2012-08-23 DIAGNOSIS — Z713 Dietary counseling and surveillance: Secondary | ICD-10-CM | POA: Insufficient documentation

## 2012-08-23 NOTE — ED Notes (Signed)
Diabetes Education: Seen for GDM diet and self-management.  HT: 63.5 in, Wt: 216.2 lb.  EDD: 11/09/2012.  Currently at about 28 weeks.  Has a strong family history for diabetes, and 3 hr OGGT results of: Fasting=84; 1 hr=176; 2h= 179; 3 hr= 151 mg.  Has a history of HTN, Crohn's DZ.  Current medications include; prenatal vitamins, 2 BP medications, and Zantac for heart burn.  Completed review of the diet, carb counting and dietary recommendations.  Did not have a Medicaid preferred meter.  Gave her (no charge) a Primary school teacher to use until she can get her MD to place an order for the Medicaid preferred monitoring kit with the strips and lancets to match the meter.  (Accu-Chek Smartview meter kit, Accu-Chek Smartview strips, and the Accu-Chek  Fastclix lancets).  On return demonstration, her blood glucose was 97 mg.  Instructed to monitor fasting and 2 hour post meal blood glucose levels.  Instructed to bring meter and blood glucose log to all [facility] appointments.  Maggie Dejuan Elman, RN, CDE

## 2012-08-24 ENCOUNTER — Telehealth: Payer: Self-pay | Admitting: *Deleted

## 2012-08-24 NOTE — Telephone Encounter (Signed)
Accucheck meter test strips and lancets were called to [facility] on [address], I was unable to find them in our medication list so I called them directly to her pharmacy.

## 2012-08-27 ENCOUNTER — Ambulatory Visit (INDEPENDENT_AMBULATORY_CARE_PROVIDER_SITE_OTHER): Payer: Medicaid Other | Admitting: Obstetrics and Gynecology

## 2012-08-27 VITALS — BP 131/82 | Wt 218.0 lb

## 2012-08-27 DIAGNOSIS — IMO0002 Reserved for concepts with insufficient information to code with codable children: Secondary | ICD-10-CM

## 2012-08-27 DIAGNOSIS — O139 Gestational [pregnancy-induced] hypertension without significant proteinuria, unspecified trimester: Secondary | ICD-10-CM

## 2012-08-27 DIAGNOSIS — E669 Obesity, unspecified: Secondary | ICD-10-CM

## 2012-08-27 DIAGNOSIS — Z3480 Encounter for supervision of other normal pregnancy, unspecified trimester: Secondary | ICD-10-CM

## 2012-08-27 DIAGNOSIS — O99210 Obesity complicating pregnancy, unspecified trimester: Secondary | ICD-10-CM

## 2012-08-27 DIAGNOSIS — O24419 Gestational diabetes mellitus in pregnancy, unspecified control: Secondary | ICD-10-CM

## 2012-08-27 DIAGNOSIS — Z3490 Encounter for supervision of normal pregnancy, unspecified, unspecified trimester: Secondary | ICD-10-CM

## 2012-08-27 DIAGNOSIS — Z302 Encounter for sterilization: Secondary | ICD-10-CM

## 2012-08-27 DIAGNOSIS — O99810 Abnormal glucose complicating pregnancy: Secondary | ICD-10-CM

## 2012-08-27 NOTE — Progress Notes (Signed)
CBG fasting 92-101 2hr pp 96-123 (majority within range). Patient not taking bedtime snacks. Reviewed diet with patient. Also advised patient to exercise regularly (30 min/ day walking). Patient otherwise doing well and without complaints. FM/PTL precautions reviewed.

## 2012-08-27 NOTE — Progress Notes (Incomplete)
Patient needs to sign BTL papers at next visit

## 2012-09-11 ENCOUNTER — Encounter: Payer: Self-pay | Admitting: Family Medicine

## 2012-09-11 ENCOUNTER — Ambulatory Visit (INDEPENDENT_AMBULATORY_CARE_PROVIDER_SITE_OTHER): Payer: Medicaid Other | Admitting: Family Medicine

## 2012-09-11 VITALS — BP 130/70 | Wt 218.0 lb

## 2012-09-11 DIAGNOSIS — B373 Candidiasis of vulva and vagina: Secondary | ICD-10-CM

## 2012-09-11 DIAGNOSIS — N949 Unspecified condition associated with female genital organs and menstrual cycle: Secondary | ICD-10-CM

## 2012-09-11 DIAGNOSIS — O24419 Gestational diabetes mellitus in pregnancy, unspecified control: Secondary | ICD-10-CM

## 2012-09-11 DIAGNOSIS — O99810 Abnormal glucose complicating pregnancy: Secondary | ICD-10-CM

## 2012-09-11 DIAGNOSIS — Z23 Encounter for immunization: Secondary | ICD-10-CM

## 2012-09-11 DIAGNOSIS — O139 Gestational [pregnancy-induced] hypertension without significant proteinuria, unspecified trimester: Secondary | ICD-10-CM

## 2012-09-11 DIAGNOSIS — R102 Pelvic and perineal pain: Secondary | ICD-10-CM

## 2012-09-11 DIAGNOSIS — O26899 Other specified pregnancy related conditions, unspecified trimester: Secondary | ICD-10-CM

## 2012-09-11 DIAGNOSIS — R319 Hematuria, unspecified: Secondary | ICD-10-CM

## 2012-09-11 DIAGNOSIS — Z3480 Encounter for supervision of other normal pregnancy, unspecified trimester: Secondary | ICD-10-CM

## 2012-09-11 LAB — POCT URINALYSIS DIPSTICK
Ketones, UA: NEGATIVE
Spec Grav, UA: 1.02
Urobilinogen, UA: NEGATIVE

## 2012-09-11 MED ORDER — FLUCONAZOLE 150 MG PO TABS: 150.0000 mg | ORAL_TABLET | Freq: Every day | ORAL | Status: DC

## 2012-09-11 MED ORDER — CEPHALEXIN 500 MG PO CAPS: 500.0000 mg | ORAL_CAPSULE | Freq: Four times a day (QID) | ORAL | Status: DC

## 2012-09-11 MED ORDER — GLYBURIDE 2.5 MG PO TABS: 2.5000 mg | ORAL_TABLET | Freq: Every day | ORAL | Status: DC

## 2012-09-11 MED ORDER — INFLUENZA VIRUS VACC SPLIT PF IM SUSP: 0.5000 mL | Freq: Once | INTRAMUSCULAR | Status: DC

## 2012-09-11 NOTE — Progress Notes (Signed)
FBS 100-117 2 hour pp 92-130--needs meds-will add glyburide--will start 2x/wk testing next wk. C/o pelvic pressure and lower abd. Pain.  U/S c/w UTI--will treat and check culture.  Cervix is 1 cm at ext. Os, multiparous and closed at internal os.

## 2012-09-11 NOTE — Progress Notes (Signed)
Patient is having increased pelvic pressure and discomfort for three or four days getting worse.  A small amount of cramping at the bottom of her stomach/top of vagina.  Some tightening in her abdominal area, swollen sensation. Yesterday noticed a thin watery discharge.  Trace blood and 2+ leukocytes in her urine.  Not having any change in her urinary symptoms.

## 2012-09-11 NOTE — Patient Instructions (Signed)
Gestational Diabetes Mellitus Gestational diabetes mellitus (GDM) is diabetes that occurs only during pregnancy. This happens when the body cannot properly handle the glucose (sugar) that increases in the blood after eating. During pregnancy, insulin resistance (reduced sensitivity to insulin) occurs because of the release of hormones from the placenta. Usually, the pancreas of pregnant women produces enough insulin to overcome the resistance that occurs. However, in gestational diabetes, the insulin is there but it does not work effectively. If the resistance is severe enough that the pancreas does not produce enough insulin, extra glucose builds up in the blood.  WHO IS AT RISK FOR DEVELOPING GESTATIONAL DIABETES?  Women with a history of diabetes in the family.  Women over age 25.  Women who are overweight.  Women in certain ethnic groups (Hispanic, African American, Native American, Asian and Pacific Islander). WHAT CAN HAPPEN TO THE BABY? If the mother's blood glucose is too high while she is pregnant, the extra sugar will travel through the umbilical cord to the baby. Some of the problems the baby may have are:  Large Baby - If the baby receives too much sugar, the baby will gain more weight. This may cause the baby to be too large to be born normally (vaginally) and a Cesarean section (C-section) may be needed.  Low Blood Glucose (hypoglycemia)  The baby makes extra insulin, in response to the extra sugar its gets from its mother. When the baby is born and no longer needs this extra insulin, the baby's blood glucose level may drop.  Jaundice (yellow coloring of the skin and eyes)  This is fairly common in babies. It is caused from a build-up of the chemical called bilirubin. This is rarely serious, but is seen more often in babies whose mothers had gestational diabetes. RISKS TO THE MOTHER Women who have had gestational diabetes may be at higher risk for some problems,  including:  Preeclampsia or toxemia, which includes problems with high blood pressure. Blood pressure and protein levels in the urine must be checked frequently.  Infections.  Cesarean section (C-section) for delivery.  Developing Type 2 diabetes later in life. About 30-50% will develop diabetes later, especially if obese. DIAGNOSIS  The hormones that cause insulin resistance are highest at about 24-28 weeks of pregnancy. If symptoms are experienced, they are much like symptoms you would normally expect during pregnancy.  GDM is often diagnosed using a two part method: 1. After 24-28 weeks of pregnancy, the woman drinks a glucose solution and takes a blood test. If the glucose level is high, a second test will be given. 2. Oral Glucose Tolerance Test (OGTT) which is 3 hours long  After not eating overnight, the blood glucose is checked. The woman drinks a glucose solution, and hourly blood glucose tests are taken. If the woman has risk factors for GDM, the caregiver may test earlier than 24 weeks of pregnancy. TREATMENT  Treatment of GDM is directed at keeping the mother's blood glucose level normal, and may include:  Meal planning.  Taking insulin or other medicine to control your blood glucose level.  Exercise.  Keeping a daily record of the foods you eat.  Blood glucose monitoring and keeping a record of your blood glucose levels.  May monitor ketone levels in the urine, although this is no longer considered necessary in most pregnancies. HOME CARE INSTRUCTIONS  While you are pregnant:  Follow your caregiver's advice regarding your prenatal appointments, meal planning, exercise, medicines, vitamins, blood and other tests, and physical   activities.  Keep a record of your meals, blood glucose tests, and the amount of insulin you are taking (if any). Show this to your caregiver at every prenatal visit.  If you have GDM, you may have problems with hypoglycemia (low blood glucose).  You may suspect this if you become suddenly dizzy, feel shaky, and/or weak. If you think this is happening and you have a glucose meter, try to test your blood glucose level. Follow your caregiver's advice for when and how to treat your low blood glucose. Generally, the 15:15 rule is followed: Treat by consuming 15 grams of carbohydrates, wait 15 minutes, and recheck blood glucose. Examples of 15 grams of carbohydrates are:  1 cup skim or low-fat milk.   cup juice.  3-4 glucose tablets.  5-6 hard candies.  1 small box raisins.   cup regular soda pop.  Practice good hygiene, to avoid infections.  Do not smoke. SEEK MEDICAL CARE IF:   You develop abnormal vaginal discharge, with or without itching.  You become weak and tired more than expected.  You seem to sweat a lot.  You have a sudden increase in weight, 5 pounds or more in one week.  You are losing weight, 3 pounds or more in a week.  Your blood glucose level is high, and you need instructions on what to do about it. SEEK IMMEDIATE MEDICAL CARE IF:   You develop a severe headache.  You faint or pass out.  You develop nausea and vomiting.  You become disoriented or confused.  You have a convulsion.  You develop vision problems.  You develop stomach pain.  You develop vaginal bleeding.  You develop uterine contractions.  You have leaking or a gush of fluid from the vagina. AFTER YOU HAVE THE BABY:  Go to all of your follow-up appointments, and have blood tests as advised by your caregiver.  Maintain a healthy lifestyle, to prevent diabetes in the future. This includes:  Following a healthy meal plan.  Controlling your weight.  Getting enough exercise and proper rest.  Do not smoke.  Breastfeed your baby if you can. This will lower the chance of you and your baby developing diabetes later in life. For more information about diabetes, go to the American Diabetes Association at:  www.americandiabetesassociation.org. For more information about gestational diabetes, go to the American Congress of Obstetricians and Gynecologists at: www.acog.org. Document Released: 02/27/2001 Document Revised: 02/13/2012 Document Reviewed: 09/21/2009 ExitCare Patient Information 2013 [employer].  Breastfeeding Deciding to breastfeed is one of the best choices you can make for you and your baby. The information that follows gives a brief overview of the benefits of breastfeeding as well as common topics surrounding breastfeeding. BENEFITS OF BREASTFEEDING For the baby  The first milk (colostrum) helps the baby's digestive system function better.   There are antibodies in the mother's milk that help the baby fight off infections.   The baby has a lower incidence of asthma, allergies, and sudden infant death syndrome (SIDS).   The nutrients in breast milk are better for the baby than infant formulas, and breast milk helps the baby's brain grow better.   Babies who breastfeed have less gas, colic, and constipation.  For the mother  Breastfeeding helps develop a very special bond between the mother and her baby.   Breastfeeding is convenient, always available at the correct temperature, and costs nothing.   Breastfeeding burns calories in the mother and helps her lose weight that was gained during pregnancy.     Breastfeeding makes the uterus contract back down to normal size faster and slows bleeding following delivery.   Breastfeeding mothers have a lower risk of developing breast cancer.  BREASTFEEDING FREQUENCY  A healthy, full-term baby may breastfeed as often as every hour or space his or her feedings to every 3 hours.   Watch your baby for signs of hunger. Nurse your baby if he or she shows signs of hunger. How often you nurse will vary from baby to baby.   Nurse as often as the baby requests, or when you feel the need to reduce the fullness of your breasts.    Awaken the baby if it has been 3 4 hours since the last feeding.   Frequent feeding will help the mother make more milk and will help prevent problems, such as sore nipples and engorgement of the breasts.  BABY'S POSITION AT THE BREAST  Whether lying down or sitting, be sure that the baby's tummy is facing your tummy.   Support the breast with 4 fingers underneath the breast and the thumb above. Make sure your fingers are well away from the nipple and baby's mouth.   Stroke the baby's lips gently with your finger or nipple.   When the baby's mouth is open wide enough, place all of your nipple and as much of the areola as possible into your baby's mouth.   Pull the baby in close so the tip of the nose and the baby's cheeks touch the breast during the feeding.  FEEDINGS AND SUCTION  The length of each feeding varies from baby to baby and from feeding to feeding.   The baby must suck about 2 3 minutes for your milk to get to him or her. This is called a "let down." For this reason, allow the baby to feed on each breast as long as he or she wants. Your baby will end the feeding when he or she has received the right balance of nutrients.   To break the suction, put your finger into the corner of the baby's mouth and slide it between his or her gums before removing your breast from his or her mouth. This will help prevent sore nipples.  HOW TO TELL WHETHER YOUR BABY IS GETTING ENOUGH BREAST MILK. Wondering whether or not your baby is getting enough milk is a common concern among mothers. You can be assured that your baby is getting enough milk if:   Your baby is actively sucking and you hear swallowing.   Your baby seems relaxed and satisfied after a feeding.   Your baby nurses at least 8 12 times in a 24 hour time period. Nurse your baby until he or she unlatches or falls asleep at the first breast (at least 10 20 minutes), then offer the second side.   Your baby is wetting  5 6 disposable diapers (6 8 cloth diapers) in a 24 hour period by 5 6 days of age.   Your baby is having at least 3 4 stools every 24 hours for the first 6 weeks. The stool should be soft and yellow.   Your baby should gain 4 7 ounces per week after he or she is 4 days old.   Your breasts feel softer after nursing.  REDUCING BREAST ENGORGEMENT  In the first week after your baby is born, you may experience signs of breast engorgement. When breasts are engorged, they feel heavy, warm, full, and may be tender to the touch. You can reduce   engorgement if you:   Nurse frequently, every 2 3 hours. Mothers who breastfeed early and often have fewer problems with engorgement.   Place light ice packs on your breasts for 10 20 minutes between feedings. This reduces swelling. Wrap the ice packs in a lightweight towel to protect your skin. Bags of frozen vegetables work well for this purpose.   Take a warm shower or apply warm, moist heat to your breast for 5 10 minutes just before each feeding. This increases circulation and helps the milk flow.   Gently massage your breast before and during the feeding. Using your finger tips, massage from the chest wall towards your nipple in a circular motion.   Make sure that the baby empties at least one breast at every feeding before switching sides.   Use a breast pump to empty the breasts if your baby is sleepy or not nursing well. You may also want to pump if you are returning to work oryou feel you are getting engorged.   Avoid bottle feeds, pacifiers, or supplemental feedings of water or juice in place of breastfeeding. Breast milk is all the food your baby needs. It is not necessary for your baby to have water or formula. In fact, to help your breasts make more milk, it is best not to give your baby supplemental feedings during the early weeks.   Be sure the baby is latched on and positioned properly while breastfeeding.   Wear a supportive  bra, avoiding underwire styles.   Eat a balanced diet with enough fluids.   Rest often, relax, and take your prenatal vitamins to prevent fatigue, stress, and anemia.  If you follow these suggestions, your engorgement should improve in 24 48 hours. If you are still experiencing difficulty, call your lactation consultant or caregiver.  CARING FOR YOURSELF Take care of your breasts  Bathe or shower daily.   Avoid using soap on your nipples.   Start feedings on your left breast at one feeding and on your right breast at the next feeding.   You will notice an increase in your milk supply 2 5 days after delivery. You may feel some discomfort from engorgement, which makes your breasts very firm and often tender. Engorgement "peaks" out within 24 48 hours. In the meantime, apply warm moist towels to your breasts for 5 10 minutes before feeding. Gentle massage and expression of some milk before feeding will soften your breasts, making it easier for your baby to latch on.   Wear a well-fitting nursing bra, and air dry your nipples for a 3 4minutes after each feeding.   Only use cotton bra pads.   Only use pure lanolin on your nipples after nursing. You do not need to wash it off before feeding the baby again. Another option is to express a few drops of breast milk and gently massage it into your nipples.  Take care of yourself  Eat well-balanced meals and nutritious snacks.   Drinking milk, fruit juice, and water to satisfy your thirst (about 8 glasses a day).   Get plenty of rest.  Avoid foods that you notice affect the baby in a bad way.  SEEK MEDICAL CARE IF:   You have difficulty with breastfeeding and need help.   You have a hard, red, sore area on your breast that is accompanied by a fever.   Your baby is too sleepy to eat well or is having trouble sleeping.   Your baby is wetting less than   6 diapers a day, by 5 days of age.   Your baby's skin or white part of  his or her eyes is more yellow than it was in the hospital.   You feel depressed.  Document Released: 11/21/2005 Document Revised: 05/22/2012 Document Reviewed: 02/19/2012 [employer] Patient Information 2013 [employer].  

## 2012-09-11 NOTE — Addendum Note (Signed)
Addended by: Barbara Cower on: 09/11/2012 02:04 PM   Modules accepted: Orders

## 2012-09-12 LAB — WET PREP, GENITAL
Clue Cells Wet Prep HPF POC: NONE SEEN
Trich, Wet Prep: NONE SEEN
Yeast Wet Prep HPF POC: NONE SEEN

## 2012-09-13 ENCOUNTER — Other Ambulatory Visit: Payer: Medicaid Other

## 2012-09-13 ENCOUNTER — Telehealth: Payer: Self-pay | Admitting: *Deleted

## 2012-09-13 ENCOUNTER — Encounter (HOSPITAL_COMMUNITY): Payer: Self-pay | Admitting: *Deleted

## 2012-09-13 ENCOUNTER — Inpatient Hospital Stay (HOSPITAL_COMMUNITY)
Admission: AD | Admit: 2012-09-13 | Discharge: 2012-09-13 | Disposition: A | Discharge status: Home / Self Care | Payer: Medicaid Other | Source: Ambulatory Visit | Attending: Obstetrics & Gynecology | Admitting: Obstetrics & Gynecology

## 2012-09-13 DIAGNOSIS — O99810 Abnormal glucose complicating pregnancy: Secondary | ICD-10-CM

## 2012-09-13 DIAGNOSIS — Z3490 Encounter for supervision of normal pregnancy, unspecified, unspecified trimester: Secondary | ICD-10-CM

## 2012-09-13 DIAGNOSIS — O479 False labor, unspecified: Secondary | ICD-10-CM

## 2012-09-13 DIAGNOSIS — O99891 Other specified diseases and conditions complicating pregnancy: Secondary | ICD-10-CM | POA: Insufficient documentation

## 2012-09-13 DIAGNOSIS — O4700 False labor before 37 completed weeks of gestation, unspecified trimester: Secondary | ICD-10-CM | POA: Insufficient documentation

## 2012-09-13 DIAGNOSIS — N949 Unspecified condition associated with female genital organs and menstrual cycle: Secondary | ICD-10-CM | POA: Insufficient documentation

## 2012-09-13 LAB — FETAL FIBRONECTIN: Fetal Fibronectin: NEGATIVE

## 2012-09-13 LAB — WET PREP, GENITAL: Trich, Wet Prep: NONE SEEN

## 2012-09-13 NOTE — Telephone Encounter (Signed)
Called pt to discuss her appt today for NST. I explained that Dr. Shawnie Pons had not intended for her to start NST's until next week. She has an appt on 10/14 @ the [facility] office. I explained that if I change her [place] appt w/MFM dept to 10/17 or 10/18 she can have an NST there as well. Pt agreed to this change. I stated I will call her back with appt information later today. She said that a voice mail message could be left for her.   1315- I called pt back and left voice mail message that her MFM appt has been changed to 10/18 @ 1300- she will have her NST there as well. I will need to schedule her in our office for NST on 10/24 or 10/25. Please call back and indicate which Robin Abbott is better.

## 2012-09-13 NOTE — Progress Notes (Signed)
Pt states she fell pressure

## 2012-09-13 NOTE — Telephone Encounter (Signed)
Pt left a message stating that she needs to talk to Diane.

## 2012-09-13 NOTE — MAU Note (Signed)
Pt states she feel like her water broke 09/12/2012. Pt states small amount of clear fluid

## 2012-09-13 NOTE — MAU Provider Note (Signed)
History     CSN: [id number]  Arrival date and time: 09/13/12 1826   None     Chief Complaint  Patient presents with  . Rupture of Membranes   HPI 31 y.o. G3P1011 at [redacted]w[redacted]d with possible leak of fluid today - underwear wet, clear today. Mucous discharge yesterday. Started feeling pressure 2 days ago, seen in office 10/8 and 1 cm dilated at external os. Feeling tightening of abdomen with vaginal pressure off and on. Irregular, max twice an hour. No bleeding. Baby moving normally. No hx preterm labor. Does have GDM on glyburide. Also says her BP has been high (130s/80s). Last sono 9/19 baby 78% weight, normal AFI, cervix 3.8 cm.     OB History    Grav Para Term Preterm Abortions TAB SAB Ect Mult Living   3 1 1  0 1 0 1 0 0 1      Past Medical History  Diagnosis Date  . Hypertension in pregnancy 2009  . Hx MRSA infection 2010    tested last year for mrsa and it was negative.  . Crohn's disease   . Asthma   . Pregnancy induced hypertension   . Anxiety   . Abnormal Pap smear     at age 67  . Urinary tract infection     Past Surgical History  Procedure Date  . Ankle surgery   . Tonsillectomy   . Colposcopy     abnormal pap  . Wisdom tooth extraction     x 4  . Knee surgery     Family History  Problem Relation Age of Onset  . Diabetes Mother   . Hypertension Mother   . COPD Mother   . Depression Mother   . Anesthesia problems Mother   . Other Mother   . Hypertension Father   . Sleep apnea Father   . Other Father   . Diabetes Sister   . Heart disease Paternal Uncle   . Diabetes Maternal Grandmother   . Diabetes Maternal Grandfather     History  Substance Use Topics  . Smoking status: Former Smoker    Quit date: 03/05/2012  . Smokeless tobacco: Never Used  . Alcohol Use: No    Allergies:  Allergies  Allergen Reactions  . Hydrocodone Hives  . Tetracyclines & Related Nausea And Vomiting    Prescriptions prior to admission  Medication Sig Dispense  Refill  . acetaminophen (TYLENOL) 500 MG tablet Take 1,000 mg by mouth every 6 (six) hours as needed. For pain      . albuterol (PROVENTIL HFA;VENTOLIN HFA) 108 (90 BASE) MCG/ACT inhaler Inhale 2 puffs into the lungs every 6 (six) hours as needed. Rescue inhaler      . cephALEXin (KEFLEX) 500 MG capsule Take 1 capsule (500 mg total) by mouth 4 (four) times daily.  28 capsule  2  . glyBURIDE (DIABETA) 2.5 MG tablet Take 1 tablet (2.5 mg total) by mouth at bedtime.  30 tablet  0  . methyldopa (ALDOMET) 250 MG tablet Take 250 mg by mouth 2 (two) times daily. Take 1 pill BId      . Prenatal Vit-Fe Fumarate-FA (PRENATAL VITAMINS) 28-0.8 MG TABS Take 1 tablet by mouth daily.  30 tablet  5  . ranitidine (ZANTAC) 75 MG tablet Take 75 mg by mouth AC breakfast.      . Elastic Bandages & Supports (WRIST BRACE/LEFT MEDIUM) MISC 1 each by Does not apply route Nightly.  1 each  0  . Elastic  Bandages & Supports (WRIST BRACE/RIGHT MEDIUM) MISC 1 each by Does not apply route Nightly.  1 each  0    ROS Physical Exam   Blood pressure 131/81, pulse 99, temperature 99.9 F (37.7 C), temperature source Oral, resp. rate 18, last menstrual period 02/09/2012.  Physical Exam   FHTs:  135, mod var, accels present, no decels.  Occasional contraction correlated with mother feeling tightening  Results for orders placed during the hospital encounter of 09/13/12 (from the past 24 hour(s))  WET PREP, GENITAL     Status: Abnormal   Collection Time   09/13/12  7:50 PM      Component Value Range   Yeast Wet Prep HPF POC NONE SEEN  NONE SEEN   Trich, Wet Prep NONE SEEN  NONE SEEN   Clue Cells Wet Prep HPF POC NONE SEEN  NONE SEEN   WBC, Wet Prep HPF POC MODERATE (*) NONE SEEN  FETAL FIBRONECTIN     Status: Normal   Collection Time   09/13/12  7:50 PM      Component Value Range   Fetal Fibronectin NEGATIVE  NEGATIVE     MAU Course  Procedures    Assessment and Plan  31 y.o. G3P1011 at [redacted]w[redacted]d with  1.  Loss  of fluid - fern negative, no BV/yeast. Likely just physiological vaginal secretions. 2.  Vaginal pressure.   FFN negative. Cervix closed. 3.  Discharge home, labor precautions.   Napoleon Form 09/13/2012, 7:31 PM

## 2012-09-14 LAB — CULTURE, OB URINE: Colony Count: >=100000

## 2012-09-14 NOTE — Telephone Encounter (Addendum)
Called pt and left message that I am returning her call. I will call again later this morning- if I am not able to speak with her, I will call back next week to schedule her clinic appt for 10/24 or 10/25.  I spoke w/pt today and confirmed her clinic appt for 10/24 @ 1400 for NST/AFI.

## 2012-09-17 ENCOUNTER — Ambulatory Visit (INDEPENDENT_AMBULATORY_CARE_PROVIDER_SITE_OTHER): Payer: Medicaid Other | Admitting: Family Medicine

## 2012-09-17 VITALS — BP 92/55 | Wt 216.0 lb

## 2012-09-17 DIAGNOSIS — O99810 Abnormal glucose complicating pregnancy: Secondary | ICD-10-CM

## 2012-09-17 DIAGNOSIS — O24419 Gestational diabetes mellitus in pregnancy, unspecified control: Secondary | ICD-10-CM

## 2012-09-17 DIAGNOSIS — Z3480 Encounter for supervision of other normal pregnancy, unspecified trimester: Secondary | ICD-10-CM

## 2012-09-17 NOTE — Progress Notes (Signed)
FBS 91-110 2 hour pp 99-105 NST reviewed and reactive.

## 2012-09-17 NOTE — Patient Instructions (Signed)
Gestational Diabetes Mellitus Gestational diabetes mellitus (GDM) is diabetes that occurs only during pregnancy. This happens when the body cannot properly handle the glucose (sugar) that increases in the blood after eating. During pregnancy, insulin resistance (reduced sensitivity to insulin) occurs because of the release of hormones from the placenta. Usually, the pancreas of pregnant women produces enough insulin to overcome the resistance that occurs. However, in gestational diabetes, the insulin is there but it does not work effectively. If the resistance is severe enough that the pancreas does not produce enough insulin, extra glucose builds up in the blood.  WHO IS AT RISK FOR DEVELOPING GESTATIONAL DIABETES?  Women with a history of diabetes in the family.  Women over age 25.  Women who are overweight.  Women in certain ethnic groups (Hispanic, African American, Native American, Asian and Pacific Islander). WHAT CAN HAPPEN TO THE BABY? If the mother's blood glucose is too high while she is pregnant, the extra sugar will travel through the umbilical cord to the baby. Some of the problems the baby may have are:  Large Baby - If the baby receives too much sugar, the baby will gain more weight. This may cause the baby to be too large to be born normally (vaginally) and a Cesarean section (C-section) may be needed.  Low Blood Glucose (hypoglycemia)  The baby makes extra insulin, in response to the extra sugar its gets from its mother. When the baby is born and no longer needs this extra insulin, the baby's blood glucose level may drop.  Jaundice (yellow coloring of the skin and eyes)  This is fairly common in babies. It is caused from a build-up of the chemical called bilirubin. This is rarely serious, but is seen more often in babies whose mothers had gestational diabetes. RISKS TO THE MOTHER Women who have had gestational diabetes may be at higher risk for some problems,  including:  Preeclampsia or toxemia, which includes problems with high blood pressure. Blood pressure and protein levels in the urine must be checked frequently.  Infections.  Cesarean section (C-section) for delivery.  Developing Type 2 diabetes later in life. About 30-50% will develop diabetes later, especially if obese. DIAGNOSIS  The hormones that cause insulin resistance are highest at about 24-28 weeks of pregnancy. If symptoms are experienced, they are much like symptoms you would normally expect during pregnancy.  GDM is often diagnosed using a two part method: 1. After 24-28 weeks of pregnancy, the woman drinks a glucose solution and takes a blood test. If the glucose level is high, a second test will be given. 2. Oral Glucose Tolerance Test (OGTT) which is 3 hours long  After not eating overnight, the blood glucose is checked. The woman drinks a glucose solution, and hourly blood glucose tests are taken. If the woman has risk factors for GDM, the caregiver may test earlier than 24 weeks of pregnancy. TREATMENT  Treatment of GDM is directed at keeping the mother's blood glucose level normal, and may include:  Meal planning.  Taking insulin or other medicine to control your blood glucose level.  Exercise.  Keeping a daily record of the foods you eat.  Blood glucose monitoring and keeping a record of your blood glucose levels.  May monitor ketone levels in the urine, although this is no longer considered necessary in most pregnancies. HOME CARE INSTRUCTIONS  While you are pregnant:  Follow your caregiver's advice regarding your prenatal appointments, meal planning, exercise, medicines, vitamins, blood and other tests, and physical   activities.  Keep a record of your meals, blood glucose tests, and the amount of insulin you are taking (if any). Show this to your caregiver at every prenatal visit.  If you have GDM, you may have problems with hypoglycemia (low blood glucose).  You may suspect this if you become suddenly dizzy, feel shaky, and/or weak. If you think this is happening and you have a glucose meter, try to test your blood glucose level. Follow your caregiver's advice for when and how to treat your low blood glucose. Generally, the 15:15 rule is followed: Treat by consuming 15 grams of carbohydrates, wait 15 minutes, and recheck blood glucose. Examples of 15 grams of carbohydrates are:  1 cup skim or low-fat milk.   cup juice.  3-4 glucose tablets.  5-6 hard candies.  1 small box raisins.   cup regular soda pop.  Practice good hygiene, to avoid infections.  Do not smoke. SEEK MEDICAL CARE IF:   You develop abnormal vaginal discharge, with or without itching.  You become weak and tired more than expected.  You seem to sweat a lot.  You have a sudden increase in weight, 5 pounds or more in one week.  You are losing weight, 3 pounds or more in a week.  Your blood glucose level is high, and you need instructions on what to do about it. SEEK IMMEDIATE MEDICAL CARE IF:   You develop a severe headache.  You faint or pass out.  You develop nausea and vomiting.  You become disoriented or confused.  You have a convulsion.  You develop vision problems.  You develop stomach pain.  You develop vaginal bleeding.  You develop uterine contractions.  You have leaking or a gush of fluid from the vagina. AFTER YOU HAVE THE BABY:  Go to all of your follow-up appointments, and have blood tests as advised by your caregiver.  Maintain a healthy lifestyle, to prevent diabetes in the future. This includes:  Following a healthy meal plan.  Controlling your weight.  Getting enough exercise and proper rest.  Do not smoke.  Breastfeed your baby if you can. This will lower the chance of you and your baby developing diabetes later in life. For more information about diabetes, go to the American Diabetes Association at:  www.americandiabetesassociation.org. For more information about gestational diabetes, go to the American Congress of Obstetricians and Gynecologists at: www.acog.org. Document Released: 02/27/2001 Document Revised: 02/13/2012 Document Reviewed: 09/21/2009 ExitCare Patient Information 2013 [employer].  Breastfeeding Deciding to breastfeed is one of the best choices you can make for you and your baby. The information that follows gives a brief overview of the benefits of breastfeeding as well as common topics surrounding breastfeeding. BENEFITS OF BREASTFEEDING For the baby  The first milk (colostrum) helps the baby's digestive system function better.   There are antibodies in the mother's milk that help the baby fight off infections.   The baby has a lower incidence of asthma, allergies, and sudden infant death syndrome (SIDS).   The nutrients in breast milk are better for the baby than infant formulas, and breast milk helps the baby's brain grow better.   Babies who breastfeed have less gas, colic, and constipation.  For the mother  Breastfeeding helps develop a very special bond between the mother and her baby.   Breastfeeding is convenient, always available at the correct temperature, and costs nothing.   Breastfeeding burns calories in the mother and helps her lose weight that was gained during pregnancy.     Breastfeeding makes the uterus contract back down to normal size faster and slows bleeding following delivery.   Breastfeeding mothers have a lower risk of developing breast cancer.  BREASTFEEDING FREQUENCY  A healthy, full-term baby may breastfeed as often as every hour or space his or her feedings to every 3 hours.   Watch your baby for signs of hunger. Nurse your baby if he or she shows signs of hunger. How often you nurse will vary from baby to baby.   Nurse as often as the baby requests, or when you feel the need to reduce the fullness of your breasts.    Awaken the baby if it has been 3 4 hours since the last feeding.   Frequent feeding will help the mother make more milk and will help prevent problems, such as sore nipples and engorgement of the breasts.  BABY'S POSITION AT THE BREAST  Whether lying down or sitting, be sure that the baby's tummy is facing your tummy.   Support the breast with 4 fingers underneath the breast and the thumb above. Make sure your fingers are well away from the nipple and baby's mouth.   Stroke the baby's lips gently with your finger or nipple.   When the baby's mouth is open wide enough, place all of your nipple and as much of the areola as possible into your baby's mouth.   Pull the baby in close so the tip of the nose and the baby's cheeks touch the breast during the feeding.  FEEDINGS AND SUCTION  The length of each feeding varies from baby to baby and from feeding to feeding.   The baby must suck about 2 3 minutes for your milk to get to him or her. This is called a "let down." For this reason, allow the baby to feed on each breast as long as he or she wants. Your baby will end the feeding when he or she has received the right balance of nutrients.   To break the suction, put your finger into the corner of the baby's mouth and slide it between his or her gums before removing your breast from his or her mouth. This will help prevent sore nipples.  HOW TO TELL WHETHER YOUR BABY IS GETTING ENOUGH BREAST MILK. Wondering whether or not your baby is getting enough milk is a common concern among mothers. You can be assured that your baby is getting enough milk if:   Your baby is actively sucking and you hear swallowing.   Your baby seems relaxed and satisfied after a feeding.   Your baby nurses at least 8 12 times in a 24 hour time period. Nurse your baby until he or she unlatches or falls asleep at the first breast (at least 10 20 minutes), then offer the second side.   Your baby is wetting  5 6 disposable diapers (6 8 cloth diapers) in a 24 hour period by 5 6 days of age.   Your baby is having at least 3 4 stools every 24 hours for the first 6 weeks. The stool should be soft and yellow.   Your baby should gain 4 7 ounces per week after he or she is 4 days old.   Your breasts feel softer after nursing.  REDUCING BREAST ENGORGEMENT  In the first week after your baby is born, you may experience signs of breast engorgement. When breasts are engorged, they feel heavy, warm, full, and may be tender to the touch. You can reduce   engorgement if you:   Nurse frequently, every 2 3 hours. Mothers who breastfeed early and often have fewer problems with engorgement.   Place light ice packs on your breasts for 10 20 minutes between feedings. This reduces swelling. Wrap the ice packs in a lightweight towel to protect your skin. Bags of frozen vegetables work well for this purpose.   Take a warm shower or apply warm, moist heat to your breast for 5 10 minutes just before each feeding. This increases circulation and helps the milk flow.   Gently massage your breast before and during the feeding. Using your finger tips, massage from the chest wall towards your nipple in a circular motion.   Make sure that the baby empties at least one breast at every feeding before switching sides.   Use a breast pump to empty the breasts if your baby is sleepy or not nursing well. You may also want to pump if you are returning to work oryou feel you are getting engorged.   Avoid bottle feeds, pacifiers, or supplemental feedings of water or juice in place of breastfeeding. Breast milk is all the food your baby needs. It is not necessary for your baby to have water or formula. In fact, to help your breasts make more milk, it is best not to give your baby supplemental feedings during the early weeks.   Be sure the baby is latched on and positioned properly while breastfeeding.   Wear a supportive  bra, avoiding underwire styles.   Eat a balanced diet with enough fluids.   Rest often, relax, and take your prenatal vitamins to prevent fatigue, stress, and anemia.  If you follow these suggestions, your engorgement should improve in 24 48 hours. If you are still experiencing difficulty, call your lactation consultant or caregiver.  CARING FOR YOURSELF Take care of your breasts  Bathe or shower daily.   Avoid using soap on your nipples.   Start feedings on your left breast at one feeding and on your right breast at the next feeding.   You will notice an increase in your milk supply 2 5 days after delivery. You may feel some discomfort from engorgement, which makes your breasts very firm and often tender. Engorgement "peaks" out within 24 48 hours. In the meantime, apply warm moist towels to your breasts for 5 10 minutes before feeding. Gentle massage and expression of some milk before feeding will soften your breasts, making it easier for your baby to latch on.   Wear a well-fitting nursing bra, and air dry your nipples for a 3 4minutes after each feeding.   Only use cotton bra pads.   Only use pure lanolin on your nipples after nursing. You do not need to wash it off before feeding the baby again. Another option is to express a few drops of breast milk and gently massage it into your nipples.  Take care of yourself  Eat well-balanced meals and nutritious snacks.   Drinking milk, fruit juice, and water to satisfy your thirst (about 8 glasses a day).   Get plenty of rest.  Avoid foods that you notice affect the baby in a bad way.  SEEK MEDICAL CARE IF:   You have difficulty with breastfeeding and need help.   You have a hard, red, sore area on your breast that is accompanied by a fever.   Your baby is too sleepy to eat well or is having trouble sleeping.   Your baby is wetting less than   6 diapers a day, by 5 days of age.   Your baby's skin or white part of  his or her eyes is more yellow than it was in the hospital.   You feel depressed.  Document Released: 11/21/2005 Document Revised: 05/22/2012 Document Reviewed: 02/19/2012 [employer] Patient Information 2013 [employer].  

## 2012-09-18 ENCOUNTER — Ambulatory Visit (HOSPITAL_COMMUNITY): Payer: Medicaid Other

## 2012-09-20 ENCOUNTER — Other Ambulatory Visit: Payer: Self-pay | Admitting: Obstetrics & Gynecology

## 2012-09-20 DIAGNOSIS — O24419 Gestational diabetes mellitus in pregnancy, unspecified control: Secondary | ICD-10-CM

## 2012-09-21 ENCOUNTER — Encounter (HOSPITAL_COMMUNITY): Payer: Self-pay

## 2012-09-21 ENCOUNTER — Ambulatory Visit (HOSPITAL_COMMUNITY)
Admission: RE | Admit: 2012-09-21 | Discharge: 2012-09-21 | Disposition: A | Discharge status: Home / Self Care | Payer: Medicaid Other | Source: Ambulatory Visit | Attending: Obstetrics and Gynecology | Admitting: Obstetrics and Gynecology

## 2012-09-21 VITALS — BP 114/72 | HR 90 | Wt 218.5 lb

## 2012-09-21 DIAGNOSIS — O139 Gestational [pregnancy-induced] hypertension without significant proteinuria, unspecified trimester: Secondary | ICD-10-CM

## 2012-09-21 DIAGNOSIS — Z302 Encounter for sterilization: Secondary | ICD-10-CM

## 2012-09-21 DIAGNOSIS — Z3490 Encounter for supervision of normal pregnancy, unspecified, unspecified trimester: Secondary | ICD-10-CM

## 2012-09-21 DIAGNOSIS — O99210 Obesity complicating pregnancy, unspecified trimester: Secondary | ICD-10-CM

## 2012-09-21 DIAGNOSIS — K5090 Crohn's disease, unspecified, without complications: Secondary | ICD-10-CM

## 2012-09-21 DIAGNOSIS — O24419 Gestational diabetes mellitus in pregnancy, unspecified control: Secondary | ICD-10-CM

## 2012-09-21 DIAGNOSIS — O3440 Maternal care for other abnormalities of cervix, unspecified trimester: Secondary | ICD-10-CM | POA: Insufficient documentation

## 2012-09-21 DIAGNOSIS — O34219 Maternal care for unspecified type scar from previous cesarean delivery: Secondary | ICD-10-CM | POA: Insufficient documentation

## 2012-09-21 DIAGNOSIS — O09299 Supervision of pregnancy with other poor reproductive or obstetric history, unspecified trimester: Secondary | ICD-10-CM | POA: Insufficient documentation

## 2012-09-21 DIAGNOSIS — O99810 Abnormal glucose complicating pregnancy: Secondary | ICD-10-CM | POA: Insufficient documentation

## 2012-09-21 NOTE — Progress Notes (Signed)
Maternal Fetal Care ultrasound  Indication: 31 yr old G3P1011 at [redacted]w[redacted]d with chronic hypertension and gestational diabetes A2 for follow up ultrasound.  Findings: 1. Single intrauterine pregnancy. 2. Estimated fetal weight is in the 76th%. The abdominal circumference is in the >97th%. 3. Posterior placenta without evidence of previa. 4. Normal amniotic fluid index; although increased for gestational age at 2cm. 5. The limited anatomy survey is normal.  Recommendations: 1. Appropriate fetal growth; although abdominal circumference is accelerated. 2. Gestational diabetes A2: - patient is on glyburide 2.5mg  qhs - elevated amniotic fluid index and accelerated abdominal circumference suggests sugars are not well controlled. Recommend close surveillance and adjustment of medication as needed to keep fasting blood sugars <90 and 2 hour post prandial values <120 - continue antenatal testing - recommend fetal growth in 4 weeks - recommend delivery by estimated due date 3. Hypertension: - on aldomet - continue surveillance as above  Eulis Foster, MD

## 2012-09-24 ENCOUNTER — Ambulatory Visit (INDEPENDENT_AMBULATORY_CARE_PROVIDER_SITE_OTHER): Payer: Medicaid Other | Admitting: Obstetrics & Gynecology

## 2012-09-24 VITALS — BP 100/69 | Wt 218.0 lb

## 2012-09-24 DIAGNOSIS — O139 Gestational [pregnancy-induced] hypertension without significant proteinuria, unspecified trimester: Secondary | ICD-10-CM

## 2012-09-24 DIAGNOSIS — O99810 Abnormal glucose complicating pregnancy: Secondary | ICD-10-CM

## 2012-09-24 DIAGNOSIS — Z3480 Encounter for supervision of other normal pregnancy, unspecified trimester: Secondary | ICD-10-CM

## 2012-09-24 DIAGNOSIS — E669 Obesity, unspecified: Secondary | ICD-10-CM

## 2012-09-24 DIAGNOSIS — O99210 Obesity complicating pregnancy, unspecified trimester: Secondary | ICD-10-CM

## 2012-09-24 DIAGNOSIS — Z302 Encounter for sterilization: Secondary | ICD-10-CM

## 2012-09-24 DIAGNOSIS — O24419 Gestational diabetes mellitus in pregnancy, unspecified control: Secondary | ICD-10-CM

## 2012-09-24 NOTE — Patient Instructions (Signed)

## 2012-09-24 NOTE — Progress Notes (Signed)
Patient is having increase pelvic pressure and had a lot of mucous discharge over the weekend.  She is also having an increase in tightenings.

## 2012-09-24 NOTE — Progress Notes (Signed)
09/21/12 ultrasound findings ([redacted]w[redacted]d): EFW was 76th%, AC >97th%, AFI  22cm.  FBS 90s-108. 2hr PP  Mostly below <120, have two abnormal 140, 200. Emphasized adherence to diet, will increase Glyburide to 3.75mg  qhs. Reevaluate in one week. NST performed today was reviewed and was found to be reactive.  Continue recommended antenatal testing and prenatal care. No other complaints or concerns.  Fetal movement and labor precautions reviewed. BP normal today, on Aldomet 250 mg po bid.

## 2012-09-27 ENCOUNTER — Ambulatory Visit (INDEPENDENT_AMBULATORY_CARE_PROVIDER_SITE_OTHER): Payer: Medicaid Other | Admitting: Obstetrics and Gynecology

## 2012-09-27 ENCOUNTER — Ambulatory Visit (HOSPITAL_COMMUNITY)
Admission: RE | Admit: 2012-09-27 | Discharge: 2012-09-27 | Disposition: A | Discharge status: Home / Self Care | Payer: Medicaid Other | Source: Ambulatory Visit | Attending: Family Medicine | Admitting: Family Medicine

## 2012-09-27 VITALS — BP 113/73 | Temp 97.0°F | Wt 217.7 lb

## 2012-09-27 DIAGNOSIS — O99810 Abnormal glucose complicating pregnancy: Secondary | ICD-10-CM | POA: Insufficient documentation

## 2012-09-27 DIAGNOSIS — O34219 Maternal care for unspecified type scar from previous cesarean delivery: Secondary | ICD-10-CM | POA: Insufficient documentation

## 2012-09-27 DIAGNOSIS — O24419 Gestational diabetes mellitus in pregnancy, unspecified control: Secondary | ICD-10-CM

## 2012-09-27 DIAGNOSIS — O139 Gestational [pregnancy-induced] hypertension without significant proteinuria, unspecified trimester: Secondary | ICD-10-CM

## 2012-09-27 DIAGNOSIS — O3440 Maternal care for other abnormalities of cervix, unspecified trimester: Secondary | ICD-10-CM | POA: Insufficient documentation

## 2012-09-27 DIAGNOSIS — O09299 Supervision of pregnancy with other poor reproductive or obstetric history, unspecified trimester: Secondary | ICD-10-CM | POA: Insufficient documentation

## 2012-09-27 NOTE — Progress Notes (Signed)
NST reviewed and reactive. Patient complaining of contractions. Cervix ft/thick/ballotable

## 2012-09-27 NOTE — Progress Notes (Signed)
Pulse: 82

## 2012-10-01 ENCOUNTER — Encounter: Payer: Self-pay | Admitting: Family Medicine

## 2012-10-01 ENCOUNTER — Ambulatory Visit (INDEPENDENT_AMBULATORY_CARE_PROVIDER_SITE_OTHER): Payer: Medicaid Other | Admitting: Family Medicine

## 2012-10-01 VITALS — BP 125/86 | Wt 216.0 lb

## 2012-10-01 DIAGNOSIS — O99810 Abnormal glucose complicating pregnancy: Secondary | ICD-10-CM

## 2012-10-01 DIAGNOSIS — Z3480 Encounter for supervision of other normal pregnancy, unspecified trimester: Secondary | ICD-10-CM

## 2012-10-01 NOTE — Progress Notes (Signed)
FBS 83-108 1/2 are in range 2 hour pp 79-136 1 out of range NST today--NST reviewed and reactive.

## 2012-10-01 NOTE — Patient Instructions (Signed)
Gestational Diabetes Mellitus Gestational diabetes mellitus (GDM) is diabetes that occurs only during pregnancy. This happens when the body cannot properly handle the glucose (sugar) that increases in the blood after eating. During pregnancy, insulin resistance (reduced sensitivity to insulin) occurs because of the release of hormones from the placenta. Usually, the pancreas of pregnant women produces enough insulin to overcome the resistance that occurs. However, in gestational diabetes, the insulin is there but it does not work effectively. If the resistance is severe enough that the pancreas does not produce enough insulin, extra glucose builds up in the blood.  WHO IS AT RISK FOR DEVELOPING GESTATIONAL DIABETES?  Women with a history of diabetes in the family.  Women over age 25.  Women who are overweight.  Women in certain ethnic groups (Hispanic, African American, Native American, Asian and Pacific Islander). WHAT CAN HAPPEN TO THE BABY? If the mother's blood glucose is too high while she is pregnant, the extra sugar will travel through the umbilical cord to the baby. Some of the problems the baby may have are:  Large Baby - If the baby receives too much sugar, the baby will gain more weight. This may cause the baby to be too large to be born normally (vaginally) and a Cesarean section (C-section) may be needed.  Low Blood Glucose (hypoglycemia)  The baby makes extra insulin, in response to the extra sugar its gets from its mother. When the baby is born and no longer needs this extra insulin, the baby's blood glucose level may drop.  Jaundice (yellow coloring of the skin and eyes)  This is fairly common in babies. It is caused from a build-up of the chemical called bilirubin. This is rarely serious, but is seen more often in babies whose mothers had gestational diabetes. RISKS TO THE MOTHER Women who have had gestational diabetes may be at higher risk for some problems,  including:  Preeclampsia or toxemia, which includes problems with high blood pressure. Blood pressure and protein levels in the urine must be checked frequently.  Infections.  Cesarean section (C-section) for delivery.  Developing Type 2 diabetes later in life. About 30-50% will develop diabetes later, especially if obese. DIAGNOSIS  The hormones that cause insulin resistance are highest at about 24-28 weeks of pregnancy. If symptoms are experienced, they are much like symptoms you would normally expect during pregnancy.  GDM is often diagnosed using a two part method: 1. After 24-28 weeks of pregnancy, the woman drinks a glucose solution and takes a blood test. If the glucose level is high, a second test will be given. 2. Oral Glucose Tolerance Test (OGTT) which is 3 hours long  After not eating overnight, the blood glucose is checked. The woman drinks a glucose solution, and hourly blood glucose tests are taken. If the woman has risk factors for GDM, the caregiver may test earlier than 24 weeks of pregnancy. TREATMENT  Treatment of GDM is directed at keeping the mother's blood glucose level normal, and may include:  Meal planning.  Taking insulin or other medicine to control your blood glucose level.  Exercise.  Keeping a daily record of the foods you eat.  Blood glucose monitoring and keeping a record of your blood glucose levels.  May monitor ketone levels in the urine, although this is no longer considered necessary in most pregnancies. HOME CARE INSTRUCTIONS  While you are pregnant:  Follow your caregiver's advice regarding your prenatal appointments, meal planning, exercise, medicines, vitamins, blood and other tests, and physical   activities.  Keep a record of your meals, blood glucose tests, and the amount of insulin you are taking (if any). Show this to your caregiver at every prenatal visit.  If you have GDM, you may have problems with hypoglycemia (low blood glucose).  You may suspect this if you become suddenly dizzy, feel shaky, and/or weak. If you think this is happening and you have a glucose meter, try to test your blood glucose level. Follow your caregiver's advice for when and how to treat your low blood glucose. Generally, the 15:15 rule is followed: Treat by consuming 15 grams of carbohydrates, wait 15 minutes, and recheck blood glucose. Examples of 15 grams of carbohydrates are:  1 cup skim or low-fat milk.   cup juice.  3-4 glucose tablets.  5-6 hard candies.  1 small box raisins.   cup regular soda pop.  Practice good hygiene, to avoid infections.  Do not smoke. SEEK MEDICAL CARE IF:   You develop abnormal vaginal discharge, with or without itching.  You become weak and tired more than expected.  You seem to sweat a lot.  You have a sudden increase in weight, 5 pounds or more in one week.  You are losing weight, 3 pounds or more in a week.  Your blood glucose level is high, and you need instructions on what to do about it. SEEK IMMEDIATE MEDICAL CARE IF:   You develop a severe headache.  You faint or pass out.  You develop nausea and vomiting.  You become disoriented or confused.  You have a convulsion.  You develop vision problems.  You develop stomach pain.  You develop vaginal bleeding.  You develop uterine contractions.  You have leaking or a gush of fluid from the vagina. AFTER YOU HAVE THE BABY:  Go to all of your follow-up appointments, and have blood tests as advised by your caregiver.  Maintain a healthy lifestyle, to prevent diabetes in the future. This includes:  Following a healthy meal plan.  Controlling your weight.  Getting enough exercise and proper rest.  Do not smoke.  Breastfeed your baby if you can. This will lower the chance of you and your baby developing diabetes later in life. For more information about diabetes, go to the American Diabetes Association at:  www.americandiabetesassociation.org. For more information about gestational diabetes, go to the American Congress of Obstetricians and Gynecologists at: www.acog.org. Document Released: 02/27/2001 Document Revised: 02/13/2012 Document Reviewed: 09/21/2009 ExitCare Patient Information 2013 [employer].  Breastfeeding Deciding to breastfeed is one of the best choices you can make for you and your baby. The information that follows gives a brief overview of the benefits of breastfeeding as well as common topics surrounding breastfeeding. BENEFITS OF BREASTFEEDING For the baby  The first milk (colostrum) helps the baby's digestive system function better.   There are antibodies in the mother's milk that help the baby fight off infections.   The baby has a lower incidence of asthma, allergies, and sudden infant death syndrome (SIDS).   The nutrients in breast milk are better for the baby than infant formulas, and breast milk helps the baby's brain grow better.   Babies who breastfeed have less gas, colic, and constipation.  For the mother  Breastfeeding helps develop a very special bond between the mother and her baby.   Breastfeeding is convenient, always available at the correct temperature, and costs nothing.   Breastfeeding burns calories in the mother and helps her lose weight that was gained during pregnancy.     Breastfeeding makes the uterus contract back down to normal size faster and slows bleeding following delivery.   Breastfeeding mothers have a lower risk of developing breast cancer.  BREASTFEEDING FREQUENCY  A healthy, full-term baby may breastfeed as often as every hour or space his or her feedings to every 3 hours.   Watch your baby for signs of hunger. Nurse your baby if he or she shows signs of hunger. How often you nurse will vary from baby to baby.   Nurse as often as the baby requests, or when you feel the need to reduce the fullness of your breasts.    Awaken the baby if it has been 3 4 hours since the last feeding.   Frequent feeding will help the mother make more milk and will help prevent problems, such as sore nipples and engorgement of the breasts.  BABY'S POSITION AT THE BREAST  Whether lying down or sitting, be sure that the baby's tummy is facing your tummy.   Support the breast with 4 fingers underneath the breast and the thumb above. Make sure your fingers are well away from the nipple and baby's mouth.   Stroke the baby's lips gently with your finger or nipple.   When the baby's mouth is open wide enough, place all of your nipple and as much of the areola as possible into your baby's mouth.   Pull the baby in close so the tip of the nose and the baby's cheeks touch the breast during the feeding.  FEEDINGS AND SUCTION  The length of each feeding varies from baby to baby and from feeding to feeding.   The baby must suck about 2 3 minutes for your milk to get to him or her. This is called a "let down." For this reason, allow the baby to feed on each breast as long as he or she wants. Your baby will end the feeding when he or she has received the right balance of nutrients.   To break the suction, put your finger into the corner of the baby's mouth and slide it between his or her gums before removing your breast from his or her mouth. This will help prevent sore nipples.  HOW TO TELL WHETHER YOUR BABY IS GETTING ENOUGH BREAST MILK. Wondering whether or not your baby is getting enough milk is a common concern among mothers. You can be assured that your baby is getting enough milk if:   Your baby is actively sucking and you hear swallowing.   Your baby seems relaxed and satisfied after a feeding.   Your baby nurses at least 8 12 times in a 24 hour time period. Nurse your baby until he or she unlatches or falls asleep at the first breast (at least 10 20 minutes), then offer the second side.   Your baby is wetting  5 6 disposable diapers (6 8 cloth diapers) in a 24 hour period by 5 6 days of age.   Your baby is having at least 3 4 stools every 24 hours for the first 6 weeks. The stool should be soft and yellow.   Your baby should gain 4 7 ounces per week after he or she is 4 days old.   Your breasts feel softer after nursing.  REDUCING BREAST ENGORGEMENT  In the first week after your baby is born, you may experience signs of breast engorgement. When breasts are engorged, they feel heavy, warm, full, and may be tender to the touch. You can reduce   engorgement if you:   Nurse frequently, every 2 3 hours. Mothers who breastfeed early and often have fewer problems with engorgement.   Place light ice packs on your breasts for 10 20 minutes between feedings. This reduces swelling. Wrap the ice packs in a lightweight towel to protect your skin. Bags of frozen vegetables work well for this purpose.   Take a warm shower or apply warm, moist heat to your breast for 5 10 minutes just before each feeding. This increases circulation and helps the milk flow.   Gently massage your breast before and during the feeding. Using your finger tips, massage from the chest wall towards your nipple in a circular motion.   Make sure that the baby empties at least one breast at every feeding before switching sides.   Use a breast pump to empty the breasts if your baby is sleepy or not nursing well. You may also want to pump if you are returning to work oryou feel you are getting engorged.   Avoid bottle feeds, pacifiers, or supplemental feedings of water or juice in place of breastfeeding. Breast milk is all the food your baby needs. It is not necessary for your baby to have water or formula. In fact, to help your breasts make more milk, it is best not to give your baby supplemental feedings during the early weeks.   Be sure the baby is latched on and positioned properly while breastfeeding.   Wear a supportive  bra, avoiding underwire styles.   Eat a balanced diet with enough fluids.   Rest often, relax, and take your prenatal vitamins to prevent fatigue, stress, and anemia.  If you follow these suggestions, your engorgement should improve in 24 48 hours. If you are still experiencing difficulty, call your lactation consultant or caregiver.  CARING FOR YOURSELF Take care of your breasts  Bathe or shower daily.   Avoid using soap on your nipples.   Start feedings on your left breast at one feeding and on your right breast at the next feeding.   You will notice an increase in your milk supply 2 5 days after delivery. You may feel some discomfort from engorgement, which makes your breasts very firm and often tender. Engorgement "peaks" out within 24 48 hours. In the meantime, apply warm moist towels to your breasts for 5 10 minutes before feeding. Gentle massage and expression of some milk before feeding will soften your breasts, making it easier for your baby to latch on.   Wear a well-fitting nursing bra, and air dry your nipples for a 3 4minutes after each feeding.   Only use cotton bra pads.   Only use pure lanolin on your nipples after nursing. You do not need to wash it off before feeding the baby again. Another option is to express a few drops of breast milk and gently massage it into your nipples.  Take care of yourself  Eat well-balanced meals and nutritious snacks.   Drinking milk, fruit juice, and water to satisfy your thirst (about 8 glasses a day).   Get plenty of rest.  Avoid foods that you notice affect the baby in a bad way.  SEEK MEDICAL CARE IF:   You have difficulty with breastfeeding and need help.   You have a hard, red, sore area on your breast that is accompanied by a fever.   Your baby is too sleepy to eat well or is having trouble sleeping.   Your baby is wetting less than   6 diapers a day, by 5 days of age.   Your baby's skin or white part of  his or her eyes is more yellow than it was in the hospital.   You feel depressed.  Document Released: 11/21/2005 Document Revised: 05/22/2012 Document Reviewed: 02/19/2012 [employer] Patient Information 2013 [employer].  

## 2012-10-04 ENCOUNTER — Ambulatory Visit (INDEPENDENT_AMBULATORY_CARE_PROVIDER_SITE_OTHER): Payer: Medicaid Other | Admitting: *Deleted

## 2012-10-04 VITALS — BP 122/72 | Wt 214.7 lb

## 2012-10-04 DIAGNOSIS — O99810 Abnormal glucose complicating pregnancy: Secondary | ICD-10-CM

## 2012-10-04 DIAGNOSIS — O139 Gestational [pregnancy-induced] hypertension without significant proteinuria, unspecified trimester: Secondary | ICD-10-CM

## 2012-10-04 NOTE — Progress Notes (Signed)
Reactive NST  Minh Roanhorse L. Harraway-Smith, M.D., FACOG  

## 2012-10-04 NOTE — Progress Notes (Signed)
P = 107   Pt is aware of UC's- states they feel tight- cx exam offered- pt denied.  Labor sx reviewed.  Double footling breech per [place] today

## 2012-10-05 ENCOUNTER — Other Ambulatory Visit: Payer: Self-pay | Admitting: *Deleted

## 2012-10-05 ENCOUNTER — Encounter (HOSPITAL_COMMUNITY): Payer: Self-pay | Admitting: General Practice

## 2012-10-05 ENCOUNTER — Ambulatory Visit (INDEPENDENT_AMBULATORY_CARE_PROVIDER_SITE_OTHER): Payer: Medicaid Other | Admitting: Obstetrics & Gynecology

## 2012-10-05 ENCOUNTER — Inpatient Hospital Stay (HOSPITAL_COMMUNITY)
Admission: AD | Admit: 2012-10-05 | Discharge: 2012-10-08 | DRG: 781 | Disposition: A | Discharge status: Home / Self Care | Payer: Medicaid Other | Source: Ambulatory Visit | Attending: Obstetrics & Gynecology | Admitting: Obstetrics & Gynecology

## 2012-10-05 ENCOUNTER — Encounter: Payer: Self-pay | Admitting: Obstetrics & Gynecology

## 2012-10-05 VITALS — BP 123/89 | Wt 217.0 lb

## 2012-10-05 DIAGNOSIS — O139 Gestational [pregnancy-induced] hypertension without significant proteinuria, unspecified trimester: Secondary | ICD-10-CM

## 2012-10-05 DIAGNOSIS — O99210 Obesity complicating pregnancy, unspecified trimester: Secondary | ICD-10-CM

## 2012-10-05 DIAGNOSIS — O99810 Abnormal glucose complicating pregnancy: Secondary | ICD-10-CM

## 2012-10-05 DIAGNOSIS — Z3480 Encounter for supervision of other normal pregnancy, unspecified trimester: Secondary | ICD-10-CM

## 2012-10-05 DIAGNOSIS — Z3490 Encounter for supervision of normal pregnancy, unspecified, unspecified trimester: Secondary | ICD-10-CM

## 2012-10-05 DIAGNOSIS — K5090 Crohn's disease, unspecified, without complications: Secondary | ICD-10-CM | POA: Diagnosis present

## 2012-10-05 DIAGNOSIS — O2300 Infections of kidney in pregnancy, unspecified trimester: Secondary | ICD-10-CM | POA: Diagnosis present

## 2012-10-05 DIAGNOSIS — O2390 Unspecified genitourinary tract infection in pregnancy, unspecified trimester: Secondary | ICD-10-CM

## 2012-10-05 DIAGNOSIS — Z302 Encounter for sterilization: Secondary | ICD-10-CM

## 2012-10-05 DIAGNOSIS — E669 Obesity, unspecified: Secondary | ICD-10-CM | POA: Diagnosis present

## 2012-10-05 DIAGNOSIS — N12 Tubulo-interstitial nephritis, not specified as acute or chronic: Secondary | ICD-10-CM | POA: Diagnosis present

## 2012-10-05 DIAGNOSIS — R319 Hematuria, unspecified: Secondary | ICD-10-CM

## 2012-10-05 DIAGNOSIS — O24419 Gestational diabetes mellitus in pregnancy, unspecified control: Secondary | ICD-10-CM

## 2012-10-05 LAB — URINALYSIS, ROUTINE W REFLEX MICROSCOPIC
Ketones, ur: 15 mg/dL — AB
Nitrite: POSITIVE — AB
Protein, ur: 30 mg/dL — AB
Urobilinogen, UA: 4 mg/dL — ABNORMAL HIGH (ref 0.0–1.0)

## 2012-10-05 LAB — CBC WITH DIFFERENTIAL/PLATELET
Basophils Relative: 0 % (ref 0–1)
Eosinophils Absolute: 0.1 10*3/uL (ref 0.0–0.7)
Eosinophils Relative: 1 % (ref 0–5)
Hemoglobin: 10.9 g/dL — ABNORMAL LOW (ref 12.0–15.0)
MCH: 31.1 pg (ref 26.0–34.0)
MCHC: 33.2 g/dL (ref 30.0–36.0)
Monocytes Relative: 8 % (ref 3–12)
Neutrophils Relative %: 66 % (ref 43–77)

## 2012-10-05 LAB — URINE MICROSCOPIC-ADD ON

## 2012-10-05 MED ORDER — ONDANSETRON HCL 4 MG/2ML IJ SOLN: 4.0000 mg | Freq: Four times a day (QID) | INTRAMUSCULAR | Status: DC | PRN

## 2012-10-05 MED ORDER — DOCUSATE SODIUM 100 MG PO CAPS
100.0000 mg | ORAL_CAPSULE | Freq: Every day | ORAL | Status: DC
  Administered 2012-10-06 – 2012-10-07 (×2): 100 mg via ORAL
  Filled 2012-10-05 (×2): qty 1

## 2012-10-05 MED ORDER — CEFTRIAXONE SODIUM 1 G IJ SOLR
1.0000 g | Freq: Two times a day (BID) | INTRAMUSCULAR | Status: DC
  Administered 2012-10-05 – 2012-10-08 (×6): 1 g via INTRAVENOUS
  Filled 2012-10-05 (×6): qty 10

## 2012-10-05 MED ORDER — CALCIUM CARBONATE ANTACID 500 MG PO CHEW: 2.0000 | CHEWABLE_TABLET | ORAL | Status: DC | PRN

## 2012-10-05 MED ORDER — GLYBURIDE 2.5 MG PO TABS
3.7500 mg | ORAL_TABLET | Freq: Every day | ORAL | Status: DC
  Administered 2012-10-06 (×2): 3.75 mg via ORAL
  Filled 2012-10-05 (×2): qty 1

## 2012-10-05 MED ORDER — LACTATED RINGERS IV SOLN
INTRAVENOUS | Status: DC
  Administered 2012-10-05: 21:00:00 via INTRAVENOUS

## 2012-10-05 MED ORDER — ACETAMINOPHEN 325 MG PO TABS
650.0000 mg | ORAL_TABLET | ORAL | Status: DC | PRN
  Administered 2012-10-07: 650 mg via ORAL
  Filled 2012-10-05: qty 2

## 2012-10-05 MED ORDER — PRENATAL MULTIVITAMIN CH
1.0000 | ORAL_TABLET | Freq: Every day | ORAL | Status: DC
  Administered 2012-10-06 – 2012-10-08 (×3): 1 via ORAL
  Filled 2012-10-05 (×3): qty 1

## 2012-10-05 MED ORDER — FAMOTIDINE 20 MG PO TABS
10.0000 mg | ORAL_TABLET | Freq: Every day | ORAL | Status: DC
  Administered 2012-10-06 – 2012-10-08 (×3): 10 mg via ORAL
  Filled 2012-10-05 (×3): qty 1

## 2012-10-05 MED ORDER — ZOLPIDEM TARTRATE 5 MG PO TABS: 5.0000 mg | ORAL_TABLET | Freq: Every evening | ORAL | Status: DC | PRN

## 2012-10-05 MED ORDER — METHYLDOPA 250 MG PO TABS
250.0000 mg | ORAL_TABLET | Freq: Two times a day (BID) | ORAL | Status: DC
  Administered 2012-10-05 – 2012-10-08 (×6): 250 mg via ORAL
  Filled 2012-10-05 (×6): qty 1

## 2012-10-05 MED ORDER — SODIUM CHLORIDE 0.9 % IV SOLN
INTRAVENOUS | Status: DC
  Administered 2012-10-05 – 2012-10-06 (×3): via INTRAVENOUS
  Administered 2012-10-06: 125 mL/h via INTRAVENOUS
  Administered 2012-10-07 – 2012-10-08 (×3): via INTRAVENOUS

## 2012-10-05 NOTE — H&P (Signed)
Robin Abbott is a 31 y.o. female presenting for eval of right sided back pain and vomiting starting today. Denies dysuria, vag bldg/leak, fever, diarrhea. Reports +FM. Her preg has been followed by [facility] and has been remarkable for 1) GDM A2- glyburide 3.75mg  q hs 2) PIH- Aldomet 250mg  BID 3) hx pyelo/UTIs 4) asthma 5) Crohn's dx- stable 6) desires BTL.  Most recent U/S shows EFW 76% with inc abd circumference and high nl AFI. History OB History    Grav Para Term Preterm Abortions TAB SAB Ect Mult Living   3 1 1  0 1 0 1 0 0 1     Past Medical History  Diagnosis Date  . Hypertension in pregnancy 2009  . Hx MRSA infection 2010    tested last year for mrsa and it was negative.  . Crohn's disease   . Asthma   . Pregnancy induced hypertension   . Anxiety   . Abnormal Pap smear     at age 40  . Urinary tract infection    Past Surgical History  Procedure Date  . Ankle surgery   . Tonsillectomy   . Colposcopy     abnormal pap  . Wisdom tooth extraction     x 4  . Knee surgery    Family History: family history includes Anesthesia problems in her mother; COPD in her mother; Depression in her mother; Diabetes in her maternal grandfather, maternal grandmother, mother, and sister; Heart disease in her paternal uncle; Hypertension in her father and mother; Other in her father and mother; and Sleep apnea in her father. Social History:  reports that she quit smoking about 7 months ago. She has never used smokeless tobacco. She reports that she does not drink alcohol or use illicit drugs.   Prenatal Transfer Tool  Maternal Diabetes: Yes:  Diabetes Type:  Insulin/Medication controlled Genetic Screening: Normal Maternal Ultrasounds/Referrals: Normal Fetal Ultrasounds or other Referrals:  None Maternal Substance Abuse:  No Significant Maternal Medications:  Meds include: Other: glyburide; aldomet Significant Maternal Lab Results:  None Other Comments:  None  ROS    Blood  pressure 126/75, pulse 96, temperature 98.1 F (36.7 C), temperature source Oral, resp. rate 20, height 5' 3.5" (1.613 m), weight 97.523 kg (215 lb), last menstrual period 02/09/2012. Maternal Exam:  Uterine Assessment: occ ctx; no pattern; mild  Cervix: Exam deferred  Fetal Exam Fetal Monitor Review: Baseline rate: 145.  Variability: moderate (6-25 bpm).   Pattern: no decelerations and accelerations present.    Fetal State Assessment: Category I - tracings are normal.     Physical Exam  Constitutional: She is oriented to person, place, and time. She appears well-developed.  HENT:  Head: Normocephalic.  Cardiovascular: Normal rate.   Respiratory: Effort normal.  Genitourinary:       Left side: neg CVAT; right side: pos CVAT  Musculoskeletal: Normal range of motion.  Neurological: She is alert and oriented to person, place, and time.  Skin: Skin is warm and dry.  Psychiatric: She has a normal mood and affect. Her behavior is normal. Thought content normal.    Urinalysis    Component Value Date/Time   COLORURINE YELLOW 10/05/2012 1922   APPEARANCEUR CLOUDY* 10/05/2012 1922   LABSPEC 1.025 10/05/2012 1922   PHURINE 7.0 10/05/2012 1922   GLUCOSEU NEGATIVE 10/05/2012 1922   HGBUR LARGE* 10/05/2012 1922   BILIRUBINUR NEGATIVE 10/05/2012 1922   BILIRUBINUR neg 09/11/2012 1059   KETONESUR 15* 10/05/2012 1922   PROTEINUR 30* 10/05/2012  1922   UROBILINOGEN 4.0* 10/05/2012 1922   UROBILINOGEN negative 09/11/2012 1059   NITRITE POSITIVE* 10/05/2012 1922   NITRITE neg 09/11/2012 1059   LEUKOCYTESUR MODERATE* 10/05/2012 1922     Prenatal labs: ABO, Rh: O/POS/-- (04/24 1547) Antibody: NEG (04/24 1547) Rubella: 20.3 (04/24 1547) RPR: NON REAC (09/06 1044)  HBsAg: NEGATIVE (04/24 1547)  HIV: NON REACTIVE (09/06 1044)  GBS:     Assessment/Plan: IUP at 35.0 wks Pyelonephritis GDM A2 Gest HTN  Per Dr Despina Hidden, will admit for abx- Rocephin 1gm q 12 CBC with diff pending  Urine to  culture  Robin Abbott, [facility] 10/05/2012, 9:07 PM

## 2012-10-05 NOTE — MAU Note (Signed)
Seen at doctor today due to pain in R back that comes around to front. Checked my cervix at doc and it was closed. Had NST yest at [facility] clinic. Having some contractions for about a wk. After leaving doctor started throwing up and now unable to keep down anything.

## 2012-10-05 NOTE — MAU Note (Signed)
Baby is breech

## 2012-10-05 NOTE — Addendum Note (Signed)
Addended by: Barbara Cower on: 10/05/2012 11:43 AM   Modules accepted: Orders

## 2012-10-05 NOTE — MAU Note (Signed)
Philipp Deputy CNM into see patient.

## 2012-10-05 NOTE — Progress Notes (Signed)
Work in visit today for the complaint of painful contractions. She forgot her log book and monitor. Her AFI yesterday and NST were normal, double footling breech on u/s yesterday. She reports good FM (8 movements already today). I have done cultures today and have reviewed labor precautions. Reassurance given.

## 2012-10-06 LAB — GC/CHLAMYDIA PROBE AMP, GENITAL
Chlamydia, DNA Probe: NEGATIVE
GC Probe Amp, Genital: NEGATIVE

## 2012-10-06 LAB — GLUCOSE, CAPILLARY: Glucose-Capillary: 103 mg/dL — ABNORMAL HIGH (ref 70–99)

## 2012-10-06 MED ORDER — SODIUM CHLORIDE 0.9 % IV BOLUS (SEPSIS)
500.0000 mL | Freq: Once | INTRAVENOUS | Status: AC
  Administered 2012-10-06: 17:00:00 via INTRAVENOUS

## 2012-10-06 NOTE — Progress Notes (Signed)
Patient ID: BRAD MCGAUGHY, female   DOB: 07-07-1981, 31 y.o.   MRN: [id number] FACULTY PRACTICE ANTEPARTUM(COMPREHENSIVE) NOTE  Robin Abbott is a 31 y.o. G3P1011 at [redacted]w[redacted]d by early ultrasound who is admitted for pyelonephritis .   Fetal presentation is cephalic. Length of Stay:  1  Days  Subjective: Patient states she feels a bit better. Patient reports the fetal movement as active. Patient reports uterine contraction  activity as irregular, every 10 minutes. Patient reports  vaginal bleeding as none. Patient describes fluid per vagina as None.  Vitals:  Blood pressure 125/77, pulse 100, temperature 98.1 F (36.7 C), temperature source Oral, resp. rate 20, height 5' 3.5" (1.613 m), weight 215 lb (97.523 kg), last menstrual period 02/09/2012. Filed Vitals:   10/05/12 1922 10/05/12 2137 10/05/12 2138  BP: 126/75 125/77   Pulse: 96 100   Temp: 98.1 F (36.7 C)  98.1 F (36.7 C)  TempSrc: Oral  Oral  Resp: 20  20  Height: 5' 3.5" (1.613 m)  5' 3.5" (1.613 m)  Weight: 215 lb (97.523 kg)  215 lb (97.523 kg)    Physical Examination:  General appearance - alert, well appearing, and in no distress Abdomen - pregnant benign appropriate Back exam - Bilateral CVAT Fundal Height:  size equals dates Pelvic Exam:  normal external genitalia, vulva, vagina, cervix, uterus and adnexa Cervical Exam: Not evaluated. and found to be not evaluated/ n/a/n/a and fetal presentation is cephalic. Extremities: extremities normal, atraumatic, no cyanosis or edema with DTRs 2+ bilaterally Membranes:intact  Fetal Monitoring:  Baseline: 140 bpm, Variability: Good {> 6 bpm) and Accelerations: Reactive  Labs:  Recent Results (from the past 24 hour(s))  URINALYSIS, ROUTINE W REFLEX MICROSCOPIC   Collection Time   10/05/12  7:22 PM      Component Value Range   Color, Urine YELLOW  YELLOW   APPearance CLOUDY (*) CLEAR   Specific Gravity, Urine 1.025  1.005 - 1.030   pH 7.0  5.0 - 8.0   Glucose, UA  NEGATIVE  NEGATIVE mg/dL   Hgb urine dipstick LARGE (*) NEGATIVE   Bilirubin Urine NEGATIVE  NEGATIVE   Ketones, ur 15 (*) NEGATIVE mg/dL   Protein, ur 30 (*) NEGATIVE mg/dL   Urobilinogen, UA 4.0 (*) 0.0 - 1.0 mg/dL   Nitrite POSITIVE (*) NEGATIVE   Leukocytes, UA MODERATE (*) NEGATIVE  URINE MICROSCOPIC-ADD ON   Collection Time   10/05/12  7:22 PM      Component Value Range   Squamous Epithelial / LPF FEW (*) RARE   WBC, UA TOO NUMEROUS TO COUNT  <3 WBC/hpf   RBC / HPF 21-50  <3 RBC/hpf   Bacteria, UA MANY (*) RARE  CBC WITH DIFFERENTIAL   Collection Time   10/05/12  9:08 PM      Component Value Range   WBC 11.6 (*) 4.0 - 10.5 K/uL   RBC 3.51 (*) 3.87 - 5.11 MIL/uL   Hemoglobin 10.9 (*) 12.0 - 15.0 g/dL   HCT 41.3 (*) 24.4 - 01.0 %   MCV 93.4  78.0 - 100.0 fL   MCH 31.1  26.0 - 34.0 pg   MCHC 33.2  30.0 - 36.0 g/dL   RDW 27.2  53.6 - 64.4 %   Platelets 203  150 - 400 K/uL   Neutrophils Relative 66  43 - 77 %   Neutro Abs 7.6  1.7 - 7.7 K/uL   Lymphocytes Relative 25  12 - 46 %   Lymphs  Abs 2.9  0.7 - 4.0 K/uL   Monocytes Relative 8  3 - 12 %   Monocytes Absolute 0.9  0.1 - 1.0 K/uL   Eosinophils Relative 1  0 - 5 %   Eosinophils Absolute 0.1  0.0 - 0.7 K/uL   Basophils Relative 0  0 - 1 %   Basophils Absolute 0.0  0.0 - 0.1 K/uL  MRSA PCR SCREENING   Collection Time   10/05/12 10:55 PM      Component Value Range   MRSA by PCR NEGATIVE  NEGATIVE    Imaging Studies:      Medications:  Scheduled    . cefTRIAXone (ROCEPHIN)  IV  1 g Intravenous Q12H  . docusate sodium  100 mg Oral Daily  . famotidine  10 mg Oral Daily  . glyBURIDE  3.75 mg Oral QHS  . methyldopa  250 mg Oral BID  . prenatal multivitamin  1 tablet Oral Daily   I have reviewed the patient's current medications.  ASSESSMENT: Patient Active Problem List  Diagnosis  . Supervision of normal pregnancy  . Maternal Crohn's disease complicating pregnancy  . Desires Sterilization  . Obesity in  pregnancy  . PIH (pregnancy induced hypertension)  . Abnormal maternal glucose tolerance, antepartum    PLAN: 1.  Continue IV rocephin for at least 48 hour course.  Exam and findings consistent with pyelo, culture pending  EURE,LUTHER H 10/06/2012,7:56 AM

## 2012-10-07 DIAGNOSIS — N12 Tubulo-interstitial nephritis, not specified as acute or chronic: Secondary | ICD-10-CM

## 2012-10-07 DIAGNOSIS — O99810 Abnormal glucose complicating pregnancy: Secondary | ICD-10-CM

## 2012-10-07 DIAGNOSIS — O2390 Unspecified genitourinary tract infection in pregnancy, unspecified trimester: Principal | ICD-10-CM

## 2012-10-07 DIAGNOSIS — O2300 Infections of kidney in pregnancy, unspecified trimester: Secondary | ICD-10-CM | POA: Diagnosis present

## 2012-10-07 DIAGNOSIS — K5090 Crohn's disease, unspecified, without complications: Secondary | ICD-10-CM

## 2012-10-07 LAB — TYPE AND SCREEN: Antibody Screen: NEGATIVE

## 2012-10-07 LAB — GLUCOSE, CAPILLARY: Glucose-Capillary: 105 mg/dL — ABNORMAL HIGH (ref 70–99)

## 2012-10-07 MED ORDER — TERBUTALINE SULFATE 1 MG/ML IJ SOLN
0.2500 mg | Freq: Once | INTRAMUSCULAR | Status: AC
  Administered 2012-10-07: 0.25 mg via SUBCUTANEOUS
  Filled 2012-10-07: qty 1

## 2012-10-07 MED ORDER — TERBUTALINE SULFATE 1 MG/ML IJ SOLN
0.2500 mg | Freq: Once | INTRAMUSCULAR | Status: AC
  Administered 2012-10-07: 0.25 mg via SUBCUTANEOUS

## 2012-10-07 MED ORDER — GLYBURIDE 2.5 MG PO TABS
2.5000 mg | ORAL_TABLET | Freq: Every day | ORAL | Status: DC
  Filled 2012-10-07: qty 1

## 2012-10-07 MED ORDER — TERBUTALINE SULFATE 1 MG/ML IJ SOLN
INTRAMUSCULAR | Status: AC
  Filled 2012-10-07: qty 1

## 2012-10-07 MED ORDER — GLYBURIDE 5 MG PO TABS: 5.0000 mg | ORAL_TABLET | Freq: Every day | ORAL | Status: DC

## 2012-10-07 NOTE — Progress Notes (Signed)
Robin Abbott is a 31 y.o. G3P1011 at [redacted]w[redacted]d admitted to Antepartum Unit for Pyelonephritis   Subjective: Called by RN to evaluate for uterine irritability and pelvic pressure. Given terbutaline earlier today.  Objective: BP 131/72  Pulse 95  Temp 98.5 F (36.9 C) (Oral)  Resp 18  Ht 5' 3.5" (1.613 m)  Wt 215 lb (97.523 kg)  BMI 37.49 kg/m2  LMP 02/09/2012     FHT: Irritability noted SVE: Posterior, soft, 2 cm  Labs: Lab Results  Component Value Date   WBC 11.6* 10/05/2012   HGB 10.9* 10/05/2012   HCT 32.8* 10/05/2012   MCV 93.4 10/05/2012   PLT 203 10/05/2012   Assessment / Plan: - No regular contractions noted on monitor - No cervical changes - Continue to monitor. Discussed use of terbutaline with patient.  Ozro Russett 10/07/2012, 8:41 PM

## 2012-10-07 NOTE — Progress Notes (Signed)
Spoke with dr Despina Hidden - pt c/o of uc's with pain - new order for .25mg  of terb

## 2012-10-07 NOTE — Progress Notes (Addendum)
Patient ID: Robin Abbott, female   DOB: [birth year], 31 y.o.   MRN: [id number] FACULTY PRACTICE ANTEPARTUM(COMPREHENSIVE) NOTE  LACHRISHA ZIEBARTH is a 31 y.o. G3P1011 at [redacted]w[redacted]d by early ultrasound who is admitted for Pyelonephritis.   Fetal presentation is cephalic. Length of Stay:  2  Days  Subjective: Reports decreasing back pain. Patient reports the fetal movement as active. Patient reports uterine contraction  activity as irregular, every 5-10 minutes. Patient reports  vaginal bleeding as none. Patient describes fluid per vagina as None.  Vitals:  Blood pressure 119/58, pulse 98, temperature 98.2 F (36.8 C), temperature source Oral, resp. rate 20, height 5' 3.5" (1.613 m), weight 97.523 kg (215 lb), last menstrual period 02/09/2012. Physical Examination:  General appearance - alert, well appearing, and in no distress Abdomen - soft, nontender, nondistended, no masses or organomegaly Back exam - minimal CVA tenderness R>L Fundal Height:  size equals dates Extremities: extremities normal, atraumatic, no cyanosis or edema  Membranes:intact  Fetal Monitoring:  Baseline: 130 bpm, Variability: Good {> 6 bpm), Accelerations: Reactive and Decelerations: Absent  Labs:  Recent Results (from the past 24 hour(s))  GLUCOSE, CAPILLARY   Collection Time   10/06/12  2:01 PM      Component Value Range   Glucose-Capillary 103 (*) 70 - 99 mg/dL  GLUCOSE, CAPILLARY   Collection Time   10/06/12  6:56 PM      Component Value Range   Glucose-Capillary 125 (*) 70 - 99 mg/dL  GLUCOSE, CAPILLARY   Collection Time   10/06/12 11:23 PM      Component Value Range   Glucose-Capillary 103 (*) 70 - 99 mg/dL     Medications:  Scheduled    . cefTRIAXone (ROCEPHIN)  IV  1 g Intravenous Q12H  . docusate sodium  100 mg Oral Daily  . famotidine  10 mg Oral Daily  . glyBURIDE  3.75 mg Oral QHS  . methyldopa  250 mg Oral BID  . prenatal multivitamin  1 tablet Oral Daily  . [COMPLETED] sodium chloride   500 mL Intravenous Once   I have reviewed the patient's current medications.  ASSESSMENT: Patient Active Problem List  Diagnosis  . Supervision of normal pregnancy  . Maternal Crohn's disease complicating pregnancy  . Desires Sterilization  . Obesity in pregnancy  . PIH (pregnancy induced hypertension)  . Abnormal maternal glucose tolerance, antepartum  . Pyelonephritis complicating pregnancy, antepartum    PLAN: Continue inpt. X 24 hours with IV Abx then discharge. BS look good in house--fasting 63.  Exzavier Ruderman S 10/07/2012,7:05 AM

## 2012-10-07 NOTE — Progress Notes (Signed)
Spoke with dr. Despina Hidden re: pt's uc's every 2-3 min with pt stating that she does feel them - had pt turn on side and suggested that she empty bladder - pt stated that she emptied her bladder before being monitored - new order for terb

## 2012-10-08 ENCOUNTER — Other Ambulatory Visit: Payer: Self-pay | Admitting: *Deleted

## 2012-10-08 ENCOUNTER — Encounter: Payer: Medicaid Other | Admitting: Obstetrics & Gynecology

## 2012-10-08 ENCOUNTER — Telehealth: Payer: Self-pay | Admitting: *Deleted

## 2012-10-08 DIAGNOSIS — B379 Candidiasis, unspecified: Secondary | ICD-10-CM

## 2012-10-08 DIAGNOSIS — O99810 Abnormal glucose complicating pregnancy: Secondary | ICD-10-CM

## 2012-10-08 LAB — GLUCOSE, CAPILLARY
Glucose-Capillary: 96 mg/dL (ref 70–99)
Glucose-Capillary: 63 mg/dL — ABNORMAL LOW (ref 70–99)
Glucose-Capillary: 63 mg/dL — ABNORMAL LOW (ref 70–99)

## 2012-10-08 LAB — CULTURE, BETA STREP (GROUP B ONLY)

## 2012-10-08 LAB — OB RESULTS CONSOLE GBS: GBS: NEGATIVE

## 2012-10-08 MED ORDER — FLUCONAZOLE 150 MG PO TABS: 150.0000 mg | ORAL_TABLET | Freq: Once | ORAL | Status: DC

## 2012-10-08 MED ORDER — CEPHALEXIN 500 MG PO CAPS: 500.0000 mg | ORAL_CAPSULE | Freq: Four times a day (QID) | ORAL | Status: DC

## 2012-10-08 NOTE — Progress Notes (Signed)
D. Poe CNM notified of pts uc's and not feeling contractions. No orders received.

## 2012-10-08 NOTE — Progress Notes (Signed)
Post discharge review completed. 

## 2012-10-08 NOTE — Discharge Summary (Signed)
Physician Discharge Summary  Patient ID: Robin Abbott MRN: [id number] DOB/AGE: 1981-07-21 31 y.o.  Admit date: 10/05/2012 Discharge date: 10/08/2012  Admission Diagnoses: Pyelonephritis complicating pregnancy Discharge Diagnoses:  Principal Problem:  *Pyelonephritis complicating pregnancy, antepartum   Discharged Condition: good  Hospital Course: unremarkable  Consults: None  Significant Diagnostic Studies: labs:   Treatments: antibiotics: ceftriaxone  Discharge Exam: Blood pressure 115/54, pulse 96, temperature 98.8 F (37.1 C), temperature source Oral, resp. rate 18, height 5' 3.5" (1.613 m), weight 215 lb (97.523 kg), last menstrual period 02/09/2012.   Disposition: 01-Home or Self Care  Discharge Orders    Future Appointments: Provider: Department: Dept Phone: Center:   10/08/2012 2:30 PM Tereso Newcomer, MD Center for [facility] [phone] [facility]   10/11/2012 2:00 PM [facility] [phone] [facility]   10/19/2012 2:00 PM [facility] [place] [facility] [phone] [facility]   10/19/2012 3:15 PM [facility] [phone] [facility]     Future Orders Please Complete By Expires   Diet - low sodium heart healthy      Increase activity slowly          Medication List     As of 10/08/2012  8:15 AM    TAKE these medications         acetaminophen 500 MG tablet   Commonly known as: TYLENOL   Take 1,000 mg by mouth every 6 (six) hours as needed. For pain      albuterol 108 (90 BASE) MCG/ACT inhaler   Commonly known as: PROVENTIL HFA;VENTOLIN HFA   Inhale 2 puffs into the lungs every 6 (six) hours as needed. Rescue inhaler      cephALEXin 500 MG capsule   Commonly known as: KEFLEX   Take 1 capsule (500 mg total) by mouth 4 (four) times daily.      glyBURIDE 2.5 MG tablet   Commonly known as: DIABETA   Take 1 tablet (2.5 mg total) by mouth at bedtime.     methyldopa 250 MG tablet   Commonly known as: ALDOMET   Take 250 mg by mouth 2 (two) times daily. Take 1 pill BId      Prenatal Vitamins 28-0.8 MG Tabs   Take 1 tablet by mouth daily.      ranitidine 75 MG tablet   Commonly known as: ZANTAC   Take 75 mg by mouth AC breakfast.      Wrist Brace/Left Medium Misc   1 each by Does not apply route Nightly.      Wrist Brace/Right Medium Misc   1 each by Does not apply route Nightly.           Follow-up Information    In 3 days to follow up.         SignedLazaro Arms 10/08/2012, 8:15 AM

## 2012-10-08 NOTE — Telephone Encounter (Signed)
Patient was released from the hospital this morning and told to follow up here Wednesday.  We have canceled todays appointment.  She requested a Diflucan due to antibiotics she was given for a uti.

## 2012-10-09 LAB — URINE CULTURE

## 2012-10-10 ENCOUNTER — Encounter: Payer: Self-pay | Admitting: Obstetrics & Gynecology

## 2012-10-10 ENCOUNTER — Ambulatory Visit (INDEPENDENT_AMBULATORY_CARE_PROVIDER_SITE_OTHER): Payer: Medicaid Other | Admitting: Obstetrics & Gynecology

## 2012-10-10 VITALS — BP 131/81 | Wt 224.0 lb

## 2012-10-10 DIAGNOSIS — Z3480 Encounter for supervision of other normal pregnancy, unspecified trimester: Secondary | ICD-10-CM

## 2012-10-10 DIAGNOSIS — O139 Gestational [pregnancy-induced] hypertension without significant proteinuria, unspecified trimester: Secondary | ICD-10-CM

## 2012-10-10 DIAGNOSIS — O99810 Abnormal glucose complicating pregnancy: Secondary | ICD-10-CM

## 2012-10-10 DIAGNOSIS — Z3490 Encounter for supervision of normal pregnancy, unspecified, unspecified trimester: Secondary | ICD-10-CM

## 2012-10-10 NOTE — Progress Notes (Signed)
Here for a follow up visit after being in the hospital for pyeolnephritis. Good FM. She has a NST at the [facility] clinic tomorrow. Her regular visits here are Mondays. No OB problems. U/S today shows vertex lie. GBS negative.

## 2012-10-11 ENCOUNTER — Telehealth: Payer: Self-pay

## 2012-10-11 ENCOUNTER — Encounter (HOSPITAL_COMMUNITY): Payer: Self-pay | Admitting: Family

## 2012-10-11 ENCOUNTER — Ambulatory Visit (HOSPITAL_COMMUNITY)
Admission: RE | Admit: 2012-10-11 | Discharge: 2012-10-11 | Disposition: A | Discharge status: Home / Self Care | Payer: Medicaid Other | Source: Ambulatory Visit | Attending: Obstetrics & Gynecology | Admitting: Obstetrics & Gynecology

## 2012-10-11 ENCOUNTER — Ambulatory Visit (INDEPENDENT_AMBULATORY_CARE_PROVIDER_SITE_OTHER): Payer: Medicaid Other | Admitting: *Deleted

## 2012-10-11 ENCOUNTER — Inpatient Hospital Stay (HOSPITAL_COMMUNITY)
Admission: AD | Admit: 2012-10-11 | Discharge: 2012-10-11 | Disposition: A | Discharge status: Home / Self Care | Payer: Medicaid Other | Source: Ambulatory Visit | Attending: Obstetrics & Gynecology | Admitting: Obstetrics & Gynecology

## 2012-10-11 VITALS — BP 121/67 | Temp 98.6°F | Wt 223.0 lb

## 2012-10-11 DIAGNOSIS — O99810 Abnormal glucose complicating pregnancy: Secondary | ICD-10-CM | POA: Insufficient documentation

## 2012-10-11 DIAGNOSIS — O139 Gestational [pregnancy-induced] hypertension without significant proteinuria, unspecified trimester: Secondary | ICD-10-CM

## 2012-10-11 DIAGNOSIS — O368990 Maternal care for other specified fetal problems, unspecified trimester, not applicable or unspecified: Secondary | ICD-10-CM

## 2012-10-11 DIAGNOSIS — O368390 Maternal care for abnormalities of the fetal heart rate or rhythm, unspecified trimester, not applicable or unspecified: Secondary | ICD-10-CM

## 2012-10-11 DIAGNOSIS — O10019 Pre-existing essential hypertension complicating pregnancy, unspecified trimester: Secondary | ICD-10-CM | POA: Insufficient documentation

## 2012-10-11 NOTE — MAU Provider Note (Signed)
History     CSN: [id number]  Arrival date and time: 10/11/12 1456   None     No chief complaint on file.  HPI Robin Abbott is a 31 y.o. G3P1011 at [redacted]w[redacted]d with A1GDM on glyburide who was sent to MAU from clinic today for audible decelerations in heart tones on NST. She was recently discharged from the hospital for pyelonephritis and is on keflex.  Baby moving as normal. Irregular mild contraction. No bleeding or loss of fluid.  Sono 10/24 normal growth and AFI. Pt also has CHTN on methyldopa 250 mg BID. Pressures controlled. No HA/vision changes.    OB History    Grav Para Term Preterm Abortions TAB SAB Ect Mult Living   3 1 1  0 1 0 1 0 0 1      Past Medical History  Diagnosis Date  . Hypertension in pregnancy 2009  . Hx MRSA infection 2010    tested last year for mrsa and it was negative.  . Crohn's disease   . Asthma   . Pregnancy induced hypertension   . Anxiety   . Abnormal Pap smear     at age 67  . Urinary tract infection     Past Surgical History  Procedure Date  . Ankle surgery   . Tonsillectomy   . Colposcopy     abnormal pap  . Wisdom tooth extraction     x 4  . Knee surgery     Family History  Problem Relation Age of Onset  . Diabetes Mother   . Hypertension Mother   . COPD Mother   . Depression Mother   . Anesthesia problems Mother   . Other Mother   . Hypertension Father   . Sleep apnea Father   . Other Father   . Diabetes Sister   . Heart disease Paternal Uncle   . Diabetes Maternal Grandmother   . Diabetes Maternal Grandfather     History  Substance Use Topics  . Smoking status: Former Smoker    Quit date: 03/05/2012  . Smokeless tobacco: Never Used  . Alcohol Use: No    Allergies:  Allergies  Allergen Reactions  . Hydrocodone Hives  . Tetracyclines & Related Nausea And Vomiting    Prescriptions prior to admission  Medication Sig Dispense Refill  . acetaminophen (TYLENOL) 500 MG tablet Take 1,000 mg by mouth every 6  (six) hours as needed. For pain, headache      . albuterol (PROVENTIL HFA;VENTOLIN HFA) 108 (90 BASE) MCG/ACT inhaler Inhale 2 puffs into the lungs every 6 (six) hours as needed. Rescue inhaler      . cephALEXin (KEFLEX) 500 MG capsule Take 1 capsule (500 mg total) by mouth 4 (four) times daily.  28 capsule  0  . fluconazole (DIFLUCAN) 150 MG tablet Take 1 tablet (150 mg total) by mouth once. Repeat in 2-3 days if symptoms persist.  2 tablet  0  . glyBURIDE (DIABETA) 2.5 MG tablet Take 1 tablet (2.5 mg total) by mouth at bedtime.  30 tablet  0  . methyldopa (ALDOMET) 250 MG tablet Take 250 mg by mouth 2 (two) times daily. Take 1 pill BId      . Prenatal Vit-Fe Fumarate-FA (PRENATAL VITAMINS) 28-0.8 MG TABS Take 1 tablet by mouth daily.  30 tablet  5  . ranitidine (ZANTAC) 75 MG tablet Take 75 mg by mouth AC breakfast.      . [DISCONTINUED] Elastic Bandages & Supports (WRIST BRACE/LEFT MEDIUM) MISC  1 each by Does not apply route Nightly.  1 each  0    Review of Systems  Constitutional: Negative for fever and chills.  Eyes: Negative for blurred vision and double vision.  Gastrointestinal: Negative for nausea, vomiting, abdominal pain, diarrhea and constipation.  Genitourinary: Negative for dysuria and urgency.  Musculoskeletal: Negative for back pain.  Neurological: Negative for dizziness and headaches.   Physical Exam   Blood pressure 108/76, pulse 92, temperature 96.8 F (36 C), temperature source Oral, resp. rate 18, height 5\' 4"  (1.626 m), weight 101.152 kg (223 lb), last menstrual period 02/09/2012, SpO2 99.00%.  Physical Exam  Constitutional: She is oriented to person, place, and time. She appears well-developed and well-nourished. No distress.  HENT:  Head: Normocephalic and atraumatic.  Eyes: Conjunctivae normal and EOM are normal.  Neck: Normal range of motion. Neck supple.  Cardiovascular: Normal rate.   Respiratory: Effort normal. No respiratory distress.  GI: Soft. There is  no tenderness.  Musculoskeletal: She exhibits edema (mild). She exhibits no tenderness.  Neurological: She is alert and oriented to person, place, and time.  Skin: Skin is warm and dry.  Psychiatric: She has a normal mood and affect.    MAU Course  Procedures  NST:  120, moderate variability, accels present, Occasional 10-second variables Reviewed AM NST. Most of areas of low fetal heart rate were disconnected and no maternal heart rate available.   Assessment and Plan  31 y.o. G3P1011 at [redacted]w[redacted]d with possible decels on NST this morning. - Current NST reassuring - Pt has AFI this afternoon, will add BPP. - F/U Monday in clinic. - Pt instructed in kick counts,  return for decreased fetal movement  Napoleon Form 10/11/2012, 4:12 PM

## 2012-10-11 NOTE — MAU Note (Signed)
Pt was being seen for NST in HR clinic when audible decels were detected; sent to MAU for observation. Pt was also scheduled for an u/s at 1445 today - will need to reorder.

## 2012-10-11 NOTE — Telephone Encounter (Signed)
Patient needs a refill on her diabetes medicine she is out. We called it in to her [facility] pharmacy.

## 2012-10-11 NOTE — Progress Notes (Signed)
P=[phone] 

## 2012-10-11 NOTE — Progress Notes (Signed)
Repetitive variable decels on NST, sent to MAU for prolonged monitoring and BPP.

## 2012-10-13 ENCOUNTER — Encounter: Payer: Self-pay | Admitting: Advanced Practice Midwife

## 2012-10-13 DIAGNOSIS — O4190X0 Disorder of amniotic fluid and membranes, unspecified, unspecified trimester, not applicable or unspecified: Secondary | ICD-10-CM | POA: Insufficient documentation

## 2012-10-15 ENCOUNTER — Ambulatory Visit (INDEPENDENT_AMBULATORY_CARE_PROVIDER_SITE_OTHER): Payer: Medicaid Other | Admitting: Family Medicine

## 2012-10-15 DIAGNOSIS — Z3480 Encounter for supervision of other normal pregnancy, unspecified trimester: Secondary | ICD-10-CM

## 2012-10-15 NOTE — Patient Instructions (Signed)
Gestational Diabetes Mellitus Gestational diabetes mellitus (GDM) is diabetes that occurs only during pregnancy. This happens when the body cannot properly handle the glucose (sugar) that increases in the blood after eating. During pregnancy, insulin resistance (reduced sensitivity to insulin) occurs because of the release of hormones from the placenta. Usually, the pancreas of pregnant women produces enough insulin to overcome the resistance that occurs. However, in gestational diabetes, the insulin is there but it does not work effectively. If the resistance is severe enough that the pancreas does not produce enough insulin, extra glucose builds up in the blood.  WHO IS AT RISK FOR DEVELOPING GESTATIONAL DIABETES?  Women with a history of diabetes in the family.  Women over age 25.  Women who are overweight.  Women in certain ethnic groups (Hispanic, African American, Native American, Asian and Pacific Islander). WHAT CAN HAPPEN TO THE BABY? If the mother's blood glucose is too high while she is pregnant, the extra sugar will travel through the umbilical cord to the baby. Some of the problems the baby may have are:  Large Baby - If the baby receives too much sugar, the baby will gain more weight. This may cause the baby to be too large to be born normally (vaginally) and a Cesarean section (C-section) may be needed.  Low Blood Glucose (hypoglycemia)  The baby makes extra insulin, in response to the extra sugar its gets from its mother. When the baby is born and no longer needs this extra insulin, the baby's blood glucose level may drop.  Jaundice (yellow coloring of the skin and eyes)  This is fairly common in babies. It is caused from a build-up of the chemical called bilirubin. This is rarely serious, but is seen more often in babies whose mothers had gestational diabetes. RISKS TO THE MOTHER Women who have had gestational diabetes may be at higher risk for some problems,  including:  Preeclampsia or toxemia, which includes problems with high blood pressure. Blood pressure and protein levels in the urine must be checked frequently.  Infections.  Cesarean section (C-section) for delivery.  Developing Type 2 diabetes later in life. About 30-50% will develop diabetes later, especially if obese. DIAGNOSIS  The hormones that cause insulin resistance are highest at about 24-28 weeks of pregnancy. If symptoms are experienced, they are much like symptoms you would normally expect during pregnancy.  GDM is often diagnosed using a two part method: 1. After 24-28 weeks of pregnancy, the woman drinks a glucose solution and takes a blood test. If the glucose level is high, a second test will be given. 2. Oral Glucose Tolerance Test (OGTT) which is 3 hours long  After not eating overnight, the blood glucose is checked. The woman drinks a glucose solution, and hourly blood glucose tests are taken. If the woman has risk factors for GDM, the caregiver may test earlier than 24 weeks of pregnancy. TREATMENT  Treatment of GDM is directed at keeping the mother's blood glucose level normal, and may include:  Meal planning.  Taking insulin or other medicine to control your blood glucose level.  Exercise.  Keeping a daily record of the foods you eat.  Blood glucose monitoring and keeping a record of your blood glucose levels.  May monitor ketone levels in the urine, although this is no longer considered necessary in most pregnancies. HOME CARE INSTRUCTIONS  While you are pregnant:  Follow your caregiver's advice regarding your prenatal appointments, meal planning, exercise, medicines, vitamins, blood and other tests, and physical   activities.  Keep a record of your meals, blood glucose tests, and the amount of insulin you are taking (if any). Show this to your caregiver at every prenatal visit.  If you have GDM, you may have problems with hypoglycemia (low blood glucose).  You may suspect this if you become suddenly dizzy, feel shaky, and/or weak. If you think this is happening and you have a glucose meter, try to test your blood glucose level. Follow your caregiver's advice for when and how to treat your low blood glucose. Generally, the 15:15 rule is followed: Treat by consuming 15 grams of carbohydrates, wait 15 minutes, and recheck blood glucose. Examples of 15 grams of carbohydrates are:  1 cup skim or low-fat milk.   cup juice.  3-4 glucose tablets.  5-6 hard candies.  1 small box raisins.   cup regular soda pop.  Practice good hygiene, to avoid infections.  Do not smoke. SEEK MEDICAL CARE IF:   You develop abnormal vaginal discharge, with or without itching.  You become weak and tired more than expected.  You seem to sweat a lot.  You have a sudden increase in weight, 5 pounds or more in one week.  You are losing weight, 3 pounds or more in a week.  Your blood glucose level is high, and you need instructions on what to do about it. SEEK IMMEDIATE MEDICAL CARE IF:   You develop a severe headache.  You faint or pass out.  You develop nausea and vomiting.  You become disoriented or confused.  You have a convulsion.  You develop vision problems.  You develop stomach pain.  You develop vaginal bleeding.  You develop uterine contractions.  You have leaking or a gush of fluid from the vagina. AFTER YOU HAVE THE BABY:  Go to all of your follow-up appointments, and have blood tests as advised by your caregiver.  Maintain a healthy lifestyle, to prevent diabetes in the future. This includes:  Following a healthy meal plan.  Controlling your weight.  Getting enough exercise and proper rest.  Do not smoke.  Breastfeed your baby if you can. This will lower the chance of you and your baby developing diabetes later in life. For more information about diabetes, go to the American Diabetes Association at:  www.americandiabetesassociation.org. For more information about gestational diabetes, go to the American Congress of Obstetricians and Gynecologists at: www.acog.org. Document Released: 02/27/2001 Document Revised: 02/13/2012 Document Reviewed: 09/21/2009 ExitCare Patient Information 2013 [employer].  Breastfeeding Deciding to breastfeed is one of the best choices you can make for you and your baby. The information that follows gives a brief overview of the benefits of breastfeeding as well as common topics surrounding breastfeeding. BENEFITS OF BREASTFEEDING For the baby  The first milk (colostrum) helps the baby's digestive system function better.   There are antibodies in the mother's milk that help the baby fight off infections.   The baby has a lower incidence of asthma, allergies, and sudden infant death syndrome (SIDS).   The nutrients in breast milk are better for the baby than infant formulas, and breast milk helps the baby's brain grow better.   Babies who breastfeed have less gas, colic, and constipation.  For the mother  Breastfeeding helps develop a very special bond between the mother and her baby.   Breastfeeding is convenient, always available at the correct temperature, and costs nothing.   Breastfeeding burns calories in the mother and helps her lose weight that was gained during pregnancy.     Breastfeeding makes the uterus contract back down to normal size faster and slows bleeding following delivery.   Breastfeeding mothers have a lower risk of developing breast cancer.  BREASTFEEDING FREQUENCY  A healthy, full-term baby may breastfeed as often as every hour or space his or her feedings to every 3 hours.   Watch your baby for signs of hunger. Nurse your baby if he or she shows signs of hunger. How often you nurse will vary from baby to baby.   Nurse as often as the baby requests, or when you feel the need to reduce the fullness of your breasts.    Awaken the baby if it has been 3 4 hours since the last feeding.   Frequent feeding will help the mother make more milk and will help prevent problems, such as sore nipples and engorgement of the breasts.  BABY'S POSITION AT THE BREAST  Whether lying down or sitting, be sure that the baby's tummy is facing your tummy.   Support the breast with 4 fingers underneath the breast and the thumb above. Make sure your fingers are well away from the nipple and baby's mouth.   Stroke the baby's lips gently with your finger or nipple.   When the baby's mouth is open wide enough, place all of your nipple and as much of the areola as possible into your baby's mouth.   Pull the baby in close so the tip of the nose and the baby's cheeks touch the breast during the feeding.  FEEDINGS AND SUCTION  The length of each feeding varies from baby to baby and from feeding to feeding.   The baby must suck about 2 3 minutes for your milk to get to him or her. This is called a "let down." For this reason, allow the baby to feed on each breast as long as he or she wants. Your baby will end the feeding when he or she has received the right balance of nutrients.   To break the suction, put your finger into the corner of the baby's mouth and slide it between his or her gums before removing your breast from his or her mouth. This will help prevent sore nipples.  HOW TO TELL WHETHER YOUR BABY IS GETTING ENOUGH BREAST MILK. Wondering whether or not your baby is getting enough milk is a common concern among mothers. You can be assured that your baby is getting enough milk if:   Your baby is actively sucking and you hear swallowing.   Your baby seems relaxed and satisfied after a feeding.   Your baby nurses at least 8 12 times in a 24 hour time period. Nurse your baby until he or she unlatches or falls asleep at the first breast (at least 10 20 minutes), then offer the second side.   Your baby is wetting  5 6 disposable diapers (6 8 cloth diapers) in a 24 hour period by 5 6 days of age.   Your baby is having at least 3 4 stools every 24 hours for the first 6 weeks. The stool should be soft and yellow.   Your baby should gain 4 7 ounces per week after he or she is 4 days old.   Your breasts feel softer after nursing.  REDUCING BREAST ENGORGEMENT  In the first week after your baby is born, you may experience signs of breast engorgement. When breasts are engorged, they feel heavy, warm, full, and may be tender to the touch. You can reduce   engorgement if you:   Nurse frequently, every 2 3 hours. Mothers who breastfeed early and often have fewer problems with engorgement.   Place light ice packs on your breasts for 10 20 minutes between feedings. This reduces swelling. Wrap the ice packs in a lightweight towel to protect your skin. Bags of frozen vegetables work well for this purpose.   Take a warm shower or apply warm, moist heat to your breast for 5 10 minutes just before each feeding. This increases circulation and helps the milk flow.   Gently massage your breast before and during the feeding. Using your finger tips, massage from the chest wall towards your nipple in a circular motion.   Make sure that the baby empties at least one breast at every feeding before switching sides.   Use a breast pump to empty the breasts if your baby is sleepy or not nursing well. You may also want to pump if you are returning to work oryou feel you are getting engorged.   Avoid bottle feeds, pacifiers, or supplemental feedings of water or juice in place of breastfeeding. Breast milk is all the food your baby needs. It is not necessary for your baby to have water or formula. In fact, to help your breasts make more milk, it is best not to give your baby supplemental feedings during the early weeks.   Be sure the baby is latched on and positioned properly while breastfeeding.   Wear a supportive  bra, avoiding underwire styles.   Eat a balanced diet with enough fluids.   Rest often, relax, and take your prenatal vitamins to prevent fatigue, stress, and anemia.  If you follow these suggestions, your engorgement should improve in 24 48 hours. If you are still experiencing difficulty, call your lactation consultant or caregiver.  CARING FOR YOURSELF Take care of your breasts  Bathe or shower daily.   Avoid using soap on your nipples.   Start feedings on your left breast at one feeding and on your right breast at the next feeding.   You will notice an increase in your milk supply 2 5 days after delivery. You may feel some discomfort from engorgement, which makes your breasts very firm and often tender. Engorgement "peaks" out within 24 48 hours. In the meantime, apply warm moist towels to your breasts for 5 10 minutes before feeding. Gentle massage and expression of some milk before feeding will soften your breasts, making it easier for your baby to latch on.   Wear a well-fitting nursing bra, and air dry your nipples for a 3 4minutes after each feeding.   Only use cotton bra pads.   Only use pure lanolin on your nipples after nursing. You do not need to wash it off before feeding the baby again. Another option is to express a few drops of breast milk and gently massage it into your nipples.  Take care of yourself  Eat well-balanced meals and nutritious snacks.   Drinking milk, fruit juice, and water to satisfy your thirst (about 8 glasses a day).   Get plenty of rest.  Avoid foods that you notice affect the baby in a bad way.  SEEK MEDICAL CARE IF:   You have difficulty with breastfeeding and need help.   You have a hard, red, sore area on your breast that is accompanied by a fever.   Your baby is too sleepy to eat well or is having trouble sleeping.   Your baby is wetting less than   6 diapers a day, by 5 days of age.   Your baby's skin or white part of  his or her eyes is more yellow than it was in the hospital.   You feel depressed.  Document Released: 11/21/2005 Document Revised: 05/22/2012 Document Reviewed: 02/19/2012 [employer] Patient Information 2013 [employer].  

## 2012-10-15 NOTE — Progress Notes (Signed)
NST reviewed and reactive. FBS 63-97  2 hour pp 79-126

## 2012-10-19 ENCOUNTER — Ambulatory Visit (HOSPITAL_COMMUNITY)
Admission: RE | Admit: 2012-10-19 | Discharge: 2012-10-19 | Disposition: A | Discharge status: Home / Self Care | Payer: Medicaid Other | Source: Ambulatory Visit | Attending: Obstetrics and Gynecology | Admitting: Obstetrics and Gynecology

## 2012-10-19 ENCOUNTER — Other Ambulatory Visit (HOSPITAL_COMMUNITY): Payer: Self-pay | Admitting: Obstetrics and Gynecology

## 2012-10-19 ENCOUNTER — Ambulatory Visit (HOSPITAL_COMMUNITY): Admission: RE | Admit: 2012-10-19 | Payer: Medicaid Other | Source: Ambulatory Visit

## 2012-10-19 VITALS — BP 140/85 | HR 90 | Wt 221.5 lb

## 2012-10-19 DIAGNOSIS — O34219 Maternal care for unspecified type scar from previous cesarean delivery: Secondary | ICD-10-CM | POA: Insufficient documentation

## 2012-10-19 DIAGNOSIS — O139 Gestational [pregnancy-induced] hypertension without significant proteinuria, unspecified trimester: Secondary | ICD-10-CM

## 2012-10-19 DIAGNOSIS — O4190X0 Disorder of amniotic fluid and membranes, unspecified, unspecified trimester, not applicable or unspecified: Secondary | ICD-10-CM

## 2012-10-19 DIAGNOSIS — O2300 Infections of kidney in pregnancy, unspecified trimester: Secondary | ICD-10-CM

## 2012-10-19 DIAGNOSIS — O99210 Obesity complicating pregnancy, unspecified trimester: Secondary | ICD-10-CM

## 2012-10-19 DIAGNOSIS — Z3490 Encounter for supervision of normal pregnancy, unspecified, unspecified trimester: Secondary | ICD-10-CM

## 2012-10-19 DIAGNOSIS — O99810 Abnormal glucose complicating pregnancy: Secondary | ICD-10-CM | POA: Insufficient documentation

## 2012-10-19 DIAGNOSIS — O3440 Maternal care for other abnormalities of cervix, unspecified trimester: Secondary | ICD-10-CM | POA: Insufficient documentation

## 2012-10-19 DIAGNOSIS — O24419 Gestational diabetes mellitus in pregnancy, unspecified control: Secondary | ICD-10-CM

## 2012-10-19 DIAGNOSIS — K5090 Crohn's disease, unspecified, without complications: Secondary | ICD-10-CM

## 2012-10-19 DIAGNOSIS — O09299 Supervision of pregnancy with other poor reproductive or obstetric history, unspecified trimester: Secondary | ICD-10-CM | POA: Insufficient documentation

## 2012-10-19 DIAGNOSIS — Z302 Encounter for sterilization: Secondary | ICD-10-CM

## 2012-10-22 ENCOUNTER — Encounter: Payer: Self-pay | Admitting: Obstetrics and Gynecology

## 2012-10-22 ENCOUNTER — Ambulatory Visit (INDEPENDENT_AMBULATORY_CARE_PROVIDER_SITE_OTHER): Payer: Medicaid Other | Admitting: Obstetrics and Gynecology

## 2012-10-22 VITALS — BP 131/80 | Wt 219.0 lb

## 2012-10-22 DIAGNOSIS — Z3490 Encounter for supervision of normal pregnancy, unspecified, unspecified trimester: Secondary | ICD-10-CM

## 2012-10-22 DIAGNOSIS — E669 Obesity, unspecified: Secondary | ICD-10-CM

## 2012-10-22 DIAGNOSIS — O139 Gestational [pregnancy-induced] hypertension without significant proteinuria, unspecified trimester: Secondary | ICD-10-CM

## 2012-10-22 DIAGNOSIS — K5090 Crohn's disease, unspecified, without complications: Secondary | ICD-10-CM

## 2012-10-22 DIAGNOSIS — Z302 Encounter for sterilization: Secondary | ICD-10-CM

## 2012-10-22 DIAGNOSIS — O99810 Abnormal glucose complicating pregnancy: Secondary | ICD-10-CM

## 2012-10-22 DIAGNOSIS — Z3480 Encounter for supervision of other normal pregnancy, unspecified trimester: Secondary | ICD-10-CM

## 2012-10-22 DIAGNOSIS — O4190X0 Disorder of amniotic fluid and membranes, unspecified, unspecified trimester, not applicable or unspecified: Secondary | ICD-10-CM

## 2012-10-22 DIAGNOSIS — O99210 Obesity complicating pregnancy, unspecified trimester: Secondary | ICD-10-CM

## 2012-10-22 NOTE — Progress Notes (Signed)
Patient doing well. She is s/p fall on her right side now with hip soreness. She denies any abdominal trauma. Good fetal movement.  CBG all within normal limits NST reviewed and reactive Will schedule induction of labor on 11/29

## 2012-10-22 NOTE — Progress Notes (Signed)
Fell last night on the wet grass and landed on her right hip, now having pain.

## 2012-10-23 ENCOUNTER — Encounter (HOSPITAL_COMMUNITY): Payer: Self-pay | Admitting: *Deleted

## 2012-10-23 ENCOUNTER — Telehealth (HOSPITAL_COMMUNITY): Payer: Self-pay | Admitting: *Deleted

## 2012-10-23 NOTE — Telephone Encounter (Signed)
Preadmission screen  

## 2012-10-25 ENCOUNTER — Ambulatory Visit (HOSPITAL_COMMUNITY)
Admission: RE | Admit: 2012-10-25 | Discharge: 2012-10-25 | Disposition: A | Discharge status: Home / Self Care | Payer: Medicaid Other | Source: Ambulatory Visit | Attending: Family Medicine | Admitting: Family Medicine

## 2012-10-25 ENCOUNTER — Other Ambulatory Visit (HOSPITAL_COMMUNITY): Payer: Self-pay | Admitting: Maternal and Fetal Medicine

## 2012-10-25 DIAGNOSIS — Z3490 Encounter for supervision of normal pregnancy, unspecified, unspecified trimester: Secondary | ICD-10-CM

## 2012-10-25 DIAGNOSIS — O4190X0 Disorder of amniotic fluid and membranes, unspecified, unspecified trimester, not applicable or unspecified: Secondary | ICD-10-CM

## 2012-10-25 DIAGNOSIS — O24419 Gestational diabetes mellitus in pregnancy, unspecified control: Secondary | ICD-10-CM

## 2012-10-25 DIAGNOSIS — O139 Gestational [pregnancy-induced] hypertension without significant proteinuria, unspecified trimester: Secondary | ICD-10-CM

## 2012-10-25 DIAGNOSIS — O99810 Abnormal glucose complicating pregnancy: Secondary | ICD-10-CM

## 2012-10-25 DIAGNOSIS — O99210 Obesity complicating pregnancy, unspecified trimester: Secondary | ICD-10-CM

## 2012-10-25 DIAGNOSIS — O2300 Infections of kidney in pregnancy, unspecified trimester: Secondary | ICD-10-CM

## 2012-10-25 DIAGNOSIS — Z302 Encounter for sterilization: Secondary | ICD-10-CM

## 2012-10-25 DIAGNOSIS — O3440 Maternal care for other abnormalities of cervix, unspecified trimester: Secondary | ICD-10-CM | POA: Insufficient documentation

## 2012-10-25 DIAGNOSIS — O34219 Maternal care for unspecified type scar from previous cesarean delivery: Secondary | ICD-10-CM | POA: Insufficient documentation

## 2012-10-25 DIAGNOSIS — O99619 Diseases of the digestive system complicating pregnancy, unspecified trimester: Secondary | ICD-10-CM

## 2012-10-25 DIAGNOSIS — O09299 Supervision of pregnancy with other poor reproductive or obstetric history, unspecified trimester: Secondary | ICD-10-CM | POA: Insufficient documentation

## 2012-10-25 DIAGNOSIS — K5090 Crohn's disease, unspecified, without complications: Secondary | ICD-10-CM

## 2012-10-25 NOTE — Progress Notes (Signed)
[facility]  Indication: 31 yr old G3P1011 at [redacted]w[redacted]d with chronic hypertension, Crohn's disease, and gestational diabetes A2 for biophysical profile secondary to nonreactive nonstress test.  Findings: 1. Single intrauterine pregnancy. 2. Posterior placenta without evidence of previa. 3. Normal amniotic fluid volume. 4. Normal biophysical profile of 8/10.  Recommendations: 1. Gestational diabetes A2: - patient is on glyburide 3.75mg  qhs - Recommend continue close surveillance and adjustment of medication as needed to keep fasting blood sugars <90 and 2 hour post prandial values <120 - continue antenatal testing - patient is being induced on 11/02/12 2. Hypertension: - on aldomet - continue surveillance as above  Eulis Foster, MD

## 2012-10-29 ENCOUNTER — Ambulatory Visit (INDEPENDENT_AMBULATORY_CARE_PROVIDER_SITE_OTHER): Payer: Medicaid Other | Admitting: Family Medicine

## 2012-10-29 VITALS — BP 138/82 | Wt 217.0 lb

## 2012-10-29 DIAGNOSIS — Z3490 Encounter for supervision of normal pregnancy, unspecified, unspecified trimester: Secondary | ICD-10-CM

## 2012-10-29 DIAGNOSIS — O99810 Abnormal glucose complicating pregnancy: Secondary | ICD-10-CM

## 2012-10-29 DIAGNOSIS — O169 Unspecified maternal hypertension, unspecified trimester: Secondary | ICD-10-CM

## 2012-10-29 DIAGNOSIS — Z3480 Encounter for supervision of other normal pregnancy, unspecified trimester: Secondary | ICD-10-CM

## 2012-10-29 NOTE — Progress Notes (Signed)
FBS 63-90 1 hour pp 109-120 NST reviewed and reactive. For IOL 11/29

## 2012-10-29 NOTE — Patient Instructions (Signed)
Gestational Diabetes Mellitus Gestational diabetes mellitus (GDM) is diabetes that occurs only during pregnancy. This happens when the body cannot properly handle the glucose (sugar) that increases in the blood after eating. During pregnancy, insulin resistance (reduced sensitivity to insulin) occurs because of the release of hormones from the placenta. Usually, the pancreas of pregnant women produces enough insulin to overcome the resistance that occurs. However, in gestational diabetes, the insulin is there but it does not work effectively. If the resistance is severe enough that the pancreas does not produce enough insulin, extra glucose builds up in the blood.  WHO IS AT RISK FOR DEVELOPING GESTATIONAL DIABETES?  Women with a history of diabetes in the family.  Women over age 25.  Women who are overweight.  Women in certain ethnic groups (Hispanic, African American, Native American, Asian and Pacific Islander). WHAT CAN HAPPEN TO THE BABY? If the mother's blood glucose is too high while she is pregnant, the extra sugar will travel through the umbilical cord to the baby. Some of the problems the baby may have are:  Large Baby - If the baby receives too much sugar, the baby will gain more weight. This may cause the baby to be too large to be born normally (vaginally) and a Cesarean section (C-section) may be needed.  Low Blood Glucose (hypoglycemia)  The baby makes extra insulin, in response to the extra sugar its gets from its mother. When the baby is born and no longer needs this extra insulin, the baby's blood glucose level may drop.  Jaundice (yellow coloring of the skin and eyes)  This is fairly common in babies. It is caused from a build-up of the chemical called bilirubin. This is rarely serious, but is seen more often in babies whose mothers had gestational diabetes. RISKS TO THE MOTHER Women who have had gestational diabetes may be at higher risk for some problems,  including:  Preeclampsia or toxemia, which includes problems with high blood pressure. Blood pressure and protein levels in the urine must be checked frequently.  Infections.  Cesarean section (C-section) for delivery.  Developing Type 2 diabetes later in life. About 30-50% will develop diabetes later, especially if obese. DIAGNOSIS  The hormones that cause insulin resistance are highest at about 24-28 weeks of pregnancy. If symptoms are experienced, they are much like symptoms you would normally expect during pregnancy.  GDM is often diagnosed using a two part method: 1. After 24-28 weeks of pregnancy, the woman drinks a glucose solution and takes a blood test. If the glucose level is high, a second test will be given. 2. Oral Glucose Tolerance Test (OGTT) which is 3 hours long  After not eating overnight, the blood glucose is checked. The woman drinks a glucose solution, and hourly blood glucose tests are taken. If the woman has risk factors for GDM, the caregiver may test earlier than 24 weeks of pregnancy. TREATMENT  Treatment of GDM is directed at keeping the mother's blood glucose level normal, and may include:  Meal planning.  Taking insulin or other medicine to control your blood glucose level.  Exercise.  Keeping a daily record of the foods you eat.  Blood glucose monitoring and keeping a record of your blood glucose levels.  May monitor ketone levels in the urine, although this is no longer considered necessary in most pregnancies. HOME CARE INSTRUCTIONS  While you are pregnant:  Follow your caregiver's advice regarding your prenatal appointments, meal planning, exercise, medicines, vitamins, blood and other tests, and physical   activities.  Keep a record of your meals, blood glucose tests, and the amount of insulin you are taking (if any). Show this to your caregiver at every prenatal visit.  If you have GDM, you may have problems with hypoglycemia (low blood glucose).  You may suspect this if you become suddenly dizzy, feel shaky, and/or weak. If you think this is happening and you have a glucose meter, try to test your blood glucose level. Follow your caregiver's advice for when and how to treat your low blood glucose. Generally, the 15:15 rule is followed: Treat by consuming 15 grams of carbohydrates, wait 15 minutes, and recheck blood glucose. Examples of 15 grams of carbohydrates are:  1 cup skim or low-fat milk.   cup juice.  3-4 glucose tablets.  5-6 hard candies.  1 small box raisins.   cup regular soda pop.  Practice good hygiene, to avoid infections.  Do not smoke. SEEK MEDICAL CARE IF:   You develop abnormal vaginal discharge, with or without itching.  You become weak and tired more than expected.  You seem to sweat a lot.  You have a sudden increase in weight, 5 pounds or more in one week.  You are losing weight, 3 pounds or more in a week.  Your blood glucose level is high, and you need instructions on what to do about it. SEEK IMMEDIATE MEDICAL CARE IF:   You develop a severe headache.  You faint or pass out.  You develop nausea and vomiting.  You become disoriented or confused.  You have a convulsion.  You develop vision problems.  You develop stomach pain.  You develop vaginal bleeding.  You develop uterine contractions.  You have leaking or a gush of fluid from the vagina. AFTER YOU HAVE THE BABY:  Go to all of your follow-up appointments, and have blood tests as advised by your caregiver.  Maintain a healthy lifestyle, to prevent diabetes in the future. This includes:  Following a healthy meal plan.  Controlling your weight.  Getting enough exercise and proper rest.  Do not smoke.  Breastfeed your baby if you can. This will lower the chance of you and your baby developing diabetes later in life. For more information about diabetes, go to the American Diabetes Association at:  www.americandiabetesassociation.org. For more information about gestational diabetes, go to the American Congress of Obstetricians and Gynecologists at: www.acog.org. Document Released: 02/27/2001 Document Revised: 02/13/2012 Document Reviewed: 09/21/2009 ExitCare Patient Information 2013 [employer].  Breastfeeding Deciding to breastfeed is one of the best choices you can make for you and your baby. The information that follows gives a brief overview of the benefits of breastfeeding as well as common topics surrounding breastfeeding. BENEFITS OF BREASTFEEDING For the baby  The first milk (colostrum) helps the baby's digestive system function better.   There are antibodies in the mother's milk that help the baby fight off infections.   The baby has a lower incidence of asthma, allergies, and sudden infant death syndrome (SIDS).   The nutrients in breast milk are better for the baby than infant formulas, and breast milk helps the baby's brain grow better.   Babies who breastfeed have less gas, colic, and constipation.  For the mother  Breastfeeding helps develop a very special bond between the mother and her baby.   Breastfeeding is convenient, always available at the correct temperature, and costs nothing.   Breastfeeding burns calories in the mother and helps her lose weight that was gained during pregnancy.     Breastfeeding makes the uterus contract back down to normal size faster and slows bleeding following delivery.   Breastfeeding mothers have a lower risk of developing breast cancer.  BREASTFEEDING FREQUENCY  A healthy, full-term baby may breastfeed as often as every hour or space his or her feedings to every 3 hours.   Watch your baby for signs of hunger. Nurse your baby if he or she shows signs of hunger. How often you nurse will vary from baby to baby.   Nurse as often as the baby requests, or when you feel the need to reduce the fullness of your breasts.    Awaken the baby if it has been 3 4 hours since the last feeding.   Frequent feeding will help the mother make more milk and will help prevent problems, such as sore nipples and engorgement of the breasts.  BABY'S POSITION AT THE BREAST  Whether lying down or sitting, be sure that the baby's tummy is facing your tummy.   Support the breast with 4 fingers underneath the breast and the thumb above. Make sure your fingers are well away from the nipple and baby's mouth.   Stroke the baby's lips gently with your finger or nipple.   When the baby's mouth is open wide enough, place all of your nipple and as much of the areola as possible into your baby's mouth.   Pull the baby in close so the tip of the nose and the baby's cheeks touch the breast during the feeding.  FEEDINGS AND SUCTION  The length of each feeding varies from baby to baby and from feeding to feeding.   The baby must suck about 2 3 minutes for your milk to get to him or her. This is called a "let down." For this reason, allow the baby to feed on each breast as long as he or she wants. Your baby will end the feeding when he or she has received the right balance of nutrients.   To break the suction, put your finger into the corner of the baby's mouth and slide it between his or her gums before removing your breast from his or her mouth. This will help prevent sore nipples.  HOW TO TELL WHETHER YOUR BABY IS GETTING ENOUGH BREAST MILK. Wondering whether or not your baby is getting enough milk is a common concern among mothers. You can be assured that your baby is getting enough milk if:   Your baby is actively sucking and you hear swallowing.   Your baby seems relaxed and satisfied after a feeding.   Your baby nurses at least 8 12 times in a 24 hour time period. Nurse your baby until he or she unlatches or falls asleep at the first breast (at least 10 20 minutes), then offer the second side.   Your baby is wetting  5 6 disposable diapers (6 8 cloth diapers) in a 24 hour period by 5 6 days of age.   Your baby is having at least 3 4 stools every 24 hours for the first 6 weeks. The stool should be soft and yellow.   Your baby should gain 4 7 ounces per week after he or she is 4 days old.   Your breasts feel softer after nursing.  REDUCING BREAST ENGORGEMENT  In the first week after your baby is born, you may experience signs of breast engorgement. When breasts are engorged, they feel heavy, warm, full, and may be tender to the touch. You can reduce   engorgement if you:   Nurse frequently, every 2 3 hours. Mothers who breastfeed early and often have fewer problems with engorgement.   Place light ice packs on your breasts for 10 20 minutes between feedings. This reduces swelling. Wrap the ice packs in a lightweight towel to protect your skin. Bags of frozen vegetables work well for this purpose.   Take a warm shower or apply warm, moist heat to your breast for 5 10 minutes just before each feeding. This increases circulation and helps the milk flow.   Gently massage your breast before and during the feeding. Using your finger tips, massage from the chest wall towards your nipple in a circular motion.   Make sure that the baby empties at least one breast at every feeding before switching sides.   Use a breast pump to empty the breasts if your baby is sleepy or not nursing well. You may also want to pump if you are returning to work oryou feel you are getting engorged.   Avoid bottle feeds, pacifiers, or supplemental feedings of water or juice in place of breastfeeding. Breast milk is all the food your baby needs. It is not necessary for your baby to have water or formula. In fact, to help your breasts make more milk, it is best not to give your baby supplemental feedings during the early weeks.   Be sure the baby is latched on and positioned properly while breastfeeding.   Wear a supportive  bra, avoiding underwire styles.   Eat a balanced diet with enough fluids.   Rest often, relax, and take your prenatal vitamins to prevent fatigue, stress, and anemia.  If you follow these suggestions, your engorgement should improve in 24 48 hours. If you are still experiencing difficulty, call your lactation consultant or caregiver.  CARING FOR YOURSELF Take care of your breasts  Bathe or shower daily.   Avoid using soap on your nipples.   Start feedings on your left breast at one feeding and on your right breast at the next feeding.   You will notice an increase in your milk supply 2 5 days after delivery. You may feel some discomfort from engorgement, which makes your breasts very firm and often tender. Engorgement "peaks" out within 24 48 hours. In the meantime, apply warm moist towels to your breasts for 5 10 minutes before feeding. Gentle massage and expression of some milk before feeding will soften your breasts, making it easier for your baby to latch on.   Wear a well-fitting nursing bra, and air dry your nipples for a 3 4minutes after each feeding.   Only use cotton bra pads.   Only use pure lanolin on your nipples after nursing. You do not need to wash it off before feeding the baby again. Another option is to express a few drops of breast milk and gently massage it into your nipples.  Take care of yourself  Eat well-balanced meals and nutritious snacks.   Drinking milk, fruit juice, and water to satisfy your thirst (about 8 glasses a day).   Get plenty of rest.  Avoid foods that you notice affect the baby in a bad way.  SEEK MEDICAL CARE IF:   You have difficulty with breastfeeding and need help.   You have a hard, red, sore area on your breast that is accompanied by a fever.   Your baby is too sleepy to eat well or is having trouble sleeping.   Your baby is wetting less than   6 diapers a day, by 5 days of age.   Your baby's skin or white part of  his or her eyes is more yellow than it was in the hospital.   You feel depressed.  Document Released: 11/21/2005 Document Revised: 05/22/2012 Document Reviewed: 02/19/2012 [employer] Patient Information 2013 [employer].  

## 2012-11-02 ENCOUNTER — Inpatient Hospital Stay (HOSPITAL_COMMUNITY)
Admission: RE | Admit: 2012-11-02 | Discharge: 2012-11-06 | DRG: 767 | Disposition: A | Discharge status: Home / Self Care | Payer: Medicaid Other | Source: Ambulatory Visit | Attending: Family Medicine | Admitting: Family Medicine

## 2012-11-02 ENCOUNTER — Encounter (HOSPITAL_COMMUNITY): Payer: Self-pay

## 2012-11-02 VITALS — BP 128/88 | HR 67 | Temp 97.4°F | Resp 18 | Ht 63.0 in | Wt 217.0 lb

## 2012-11-02 DIAGNOSIS — O139 Gestational [pregnancy-induced] hypertension without significant proteinuria, unspecified trimester: Secondary | ICD-10-CM | POA: Diagnosis present

## 2012-11-02 DIAGNOSIS — Z3490 Encounter for supervision of normal pregnancy, unspecified, unspecified trimester: Secondary | ICD-10-CM

## 2012-11-02 DIAGNOSIS — O99892 Other specified diseases and conditions complicating childbirth: Secondary | ICD-10-CM | POA: Diagnosis present

## 2012-11-02 DIAGNOSIS — O99210 Obesity complicating pregnancy, unspecified trimester: Secondary | ICD-10-CM

## 2012-11-02 DIAGNOSIS — O99810 Abnormal glucose complicating pregnancy: Secondary | ICD-10-CM | POA: Diagnosis present

## 2012-11-02 DIAGNOSIS — O2300 Infections of kidney in pregnancy, unspecified trimester: Secondary | ICD-10-CM

## 2012-11-02 DIAGNOSIS — O99814 Abnormal glucose complicating childbirth: Secondary | ICD-10-CM | POA: Diagnosis present

## 2012-11-02 DIAGNOSIS — O24419 Gestational diabetes mellitus in pregnancy, unspecified control: Secondary | ICD-10-CM

## 2012-11-02 DIAGNOSIS — K5090 Crohn's disease, unspecified, without complications: Secondary | ICD-10-CM | POA: Diagnosis present

## 2012-11-02 DIAGNOSIS — Z302 Encounter for sterilization: Secondary | ICD-10-CM

## 2012-11-02 DIAGNOSIS — O328XX0 Maternal care for other malpresentation of fetus, not applicable or unspecified: Secondary | ICD-10-CM | POA: Diagnosis present

## 2012-11-02 DIAGNOSIS — O99619 Diseases of the digestive system complicating pregnancy, unspecified trimester: Secondary | ICD-10-CM

## 2012-11-02 DIAGNOSIS — O409XX0 Polyhydramnios, unspecified trimester, not applicable or unspecified: Secondary | ICD-10-CM | POA: Diagnosis present

## 2012-11-02 DIAGNOSIS — O4190X0 Disorder of amniotic fluid and membranes, unspecified, unspecified trimester, not applicable or unspecified: Secondary | ICD-10-CM

## 2012-11-02 LAB — GLUCOSE, CAPILLARY
Glucose-Capillary: 81 mg/dL (ref 70–99)
Glucose-Capillary: 79 mg/dL (ref 70–99)

## 2012-11-02 LAB — CBC
Hemoglobin: 10.8 g/dL — ABNORMAL LOW (ref 12.0–15.0)
MCH: 29.7 pg (ref 26.0–34.0)
Platelets: 205 10*3/uL (ref 150–400)
RBC: 3.64 MIL/uL — ABNORMAL LOW (ref 3.87–5.11)
WBC: 8.9 10*3/uL (ref 4.0–10.5)

## 2012-11-02 LAB — TYPE AND SCREEN: Antibody Screen: NEGATIVE

## 2012-11-02 MED ORDER — LACTATED RINGERS IV SOLN
INTRAVENOUS | Status: DC
  Administered 2012-11-02 – 2012-11-03 (×4): via INTRAVENOUS

## 2012-11-02 MED ORDER — LIDOCAINE HCL (PF) 1 % IJ SOLN
30.0000 mL | INTRAMUSCULAR | Status: DC | PRN
  Administered 2012-11-03: 30 mL via SUBCUTANEOUS
  Filled 2012-11-02: qty 30

## 2012-11-02 MED ORDER — OXYTOCIN 40 UNITS IN LACTATED RINGERS INFUSION - SIMPLE MED
1.0000 m[IU]/min | INTRAVENOUS | Status: DC
Start: 2012-11-02 — End: 2012-11-03
  Administered 2012-11-02: 2 m[IU]/min via INTRAVENOUS
  Filled 2012-11-02: qty 1000

## 2012-11-02 MED ORDER — FENTANYL CITRATE 0.05 MG/ML IJ SOLN
100.0000 ug | INTRAMUSCULAR | Status: DC | PRN
  Administered 2012-11-02 – 2012-11-03 (×7): 100 ug via INTRAVENOUS
  Filled 2012-11-02 (×8): qty 2

## 2012-11-02 MED ORDER — GLYBURIDE 2.5 MG PO TABS
2.5000 mg | ORAL_TABLET | Freq: Every day | ORAL | Status: DC
  Filled 2012-11-02 (×2): qty 1

## 2012-11-02 MED ORDER — PRENATAL VITAMINS 28-0.8 MG PO TABS: 1.0000 | ORAL_TABLET | Freq: Every day | ORAL | Status: DC

## 2012-11-02 MED ORDER — ONDANSETRON HCL 4 MG/2ML IJ SOLN: 4.0000 mg | Freq: Four times a day (QID) | INTRAMUSCULAR | Status: DC | PRN

## 2012-11-02 MED ORDER — OXYTOCIN BOLUS FROM INFUSION: 500.0000 mL | INTRAVENOUS | Status: DC

## 2012-11-02 MED ORDER — METHYLDOPA 250 MG PO TABS
250.0000 mg | ORAL_TABLET | Freq: Two times a day (BID) | ORAL | Status: DC
  Administered 2012-11-02 – 2012-11-03 (×3): 250 mg via ORAL
  Filled 2012-11-02 (×5): qty 1

## 2012-11-02 MED ORDER — IBUPROFEN 600 MG PO TABS: 600.0000 mg | ORAL_TABLET | Freq: Four times a day (QID) | ORAL | Status: DC | PRN

## 2012-11-02 MED ORDER — LACTATED RINGERS IV SOLN
500.0000 mL | INTRAVENOUS | Status: DC | PRN
  Administered 2012-11-03: 500 mL via INTRAVENOUS

## 2012-11-02 MED ORDER — ALBUTEROL SULFATE HFA 108 (90 BASE) MCG/ACT IN AERS: 2.0000 | INHALATION_SPRAY | Freq: Four times a day (QID) | RESPIRATORY_TRACT | Status: DC | PRN

## 2012-11-02 MED ORDER — OXYTOCIN 40 UNITS IN LACTATED RINGERS INFUSION - SIMPLE MED: 62.5000 mL/h | INTRAVENOUS | Status: DC

## 2012-11-02 MED ORDER — CITRIC ACID-SODIUM CITRATE 334-500 MG/5ML PO SOLN: 30.0000 mL | ORAL | Status: DC | PRN

## 2012-11-02 MED ORDER — PRENATAL MULTIVITAMIN CH
1.0000 | ORAL_TABLET | Freq: Every day | ORAL | Status: DC
  Administered 2012-11-02: 1 via ORAL
  Filled 2012-11-02: qty 1

## 2012-11-02 MED ORDER — ZOLPIDEM TARTRATE 5 MG PO TABS: 5.0000 mg | ORAL_TABLET | Freq: Every evening | ORAL | Status: DC | PRN

## 2012-11-02 MED ORDER — ACETAMINOPHEN 325 MG PO TABS: 650.0000 mg | ORAL_TABLET | ORAL | Status: DC | PRN

## 2012-11-02 MED ORDER — MISOPROSTOL 25 MCG QUARTER TABLET
25.0000 ug | ORAL_TABLET | Freq: Once | ORAL | Status: AC
  Administered 2012-11-02: 25 ug via VAGINAL
  Filled 2012-11-02: qty 0.25

## 2012-11-02 NOTE — Progress Notes (Signed)
Robin Abbott is a 31 y.o. G3P1011 at [redacted]w[redacted]d admitted for induction of labor due to Gestational diabetes.  Subjective: Moderate painful contractions.  Objective: BP 135/73  Pulse 65  Temp 97.8 F (36.6 C) (Oral)  Resp 18  Ht 5\' 3"  (1.6 m)  Wt 98.431 kg (217 lb)  BMI 38.44 kg/m2  LMP 02/09/2012     FHT:  FHR: 140 bpm, variability: moderate,  accelerations:  Present,  decelerations:  Absent UC:   regular, every 2 minutes SVE:   Dilation: 1 Effacement (%): 60 Station: -3 Exam by:: Dr. Aviva Signs  CBG (last 3)   Basename 11/02/12 1135  GLUCAP 73   Assessment / Plan: Induction of labor due to gestational diabetes,  progressing well on pitocin  Labor: Progressing normally Fetal Wellbeing:  Category II Pain Control:  Labor support without medications I/D:  n/a Anticipated MOD:  NSVD  PILOTO, DAYARMYS 11/02/2012, 12:44 PM

## 2012-11-02 NOTE — H&P (Signed)
Robin Abbott is a 31 y.o. female presenting for induction of labor for Gestational diabetes. Maternal Medical History:  Fetal activity: Perceived fetal activity is normal.   Last perceived fetal movement was within the past hour.    Prenatal complications: Hypertension and polyhydramnios.     OB History    Grav Para Term Preterm Abortions TAB SAB Ect Mult Living   3 1 1  0 1 0 1 0 0 1     Past Medical History  Diagnosis Date  . Hypertension in pregnancy 2009  . Hx MRSA infection 2010    tested last year for mrsa and it was negative.  . Crohn's disease   . Asthma   . Pregnancy induced hypertension   . Anxiety   . Abnormal Pap smear     at age 57  . Urinary tract infection   . Gestational diabetes     glyburide  . Chronic kidney disease     pyelonephritis during pregnancy   Past Surgical History  Procedure Date  . Ankle surgery   . Tonsillectomy   . Colposcopy     abnormal pap  . Wisdom tooth extraction     x 4  . Knee surgery    Family History: family history includes Anesthesia problems in her mother; COPD in her mother; Cancer in her paternal grandmother; Depression in her mother; Diabetes in her maternal grandfather, maternal grandmother, mother, and sister; Heart disease in her paternal uncle; Hypertension in her father and mother; and Sleep apnea in her father. Social History:  reports that she quit smoking about 7 months ago. She has never used smokeless tobacco. She reports that she does not drink alcohol or use illicit drugs.   Prenatal Transfer Tool  Maternal Diabetes: Yes:  Diabetes Type:  Insulin/Medication controlled Genetic Screening: Normal Maternal Ultrasounds/Referrals: Normal Fetal Ultrasounds or other Referrals:  None Maternal Substance Abuse:  No Significant Maternal Medications:  Meds include: Other:  Significant Maternal Lab Results:  Lab values include: Group B Strep negative Other Comments:  Crohn's Disease; Gestational  Hypertension  Review of Systems  All other systems reviewed and are negative.  Report no abnormal symptoms; here for induction of labor.    Dilation: Fingertip Effacement (%): 50 Station: Ballotable Exam by:: Roney Marion, CNM Blood pressure 129/72, pulse 77, temperature 97.4 F (36.3 C), temperature source Oral, resp. rate 18, height 5\' 3"  (1.6 m), weight 98.431 kg (217 lb), last menstrual period 02/09/2012. Maternal Exam:  Uterine Assessment: Contraction strength is mild.  Contraction frequency is irregular.   Abdomen: Estimated fetal weight is 7.5-8lbs.   Fetal presentation: vertex  Introitus: Normal vulva. Normal vagina.  Vaginal discharge: mucusy.    Fetal Exam Fetal Monitor Review: Baseline rate: 120's.  Variability: moderate (6-25 bpm).   Pattern: accelerations present.    Fetal State Assessment: Category I - tracings are normal.     Physical Exam  Constitutional: She is oriented to person, place, and time. She appears well-developed and well-nourished. No distress.  HENT:  Head: Normocephalic.  Neck: Normal range of motion. Neck supple.  Cardiovascular: Normal rate, regular rhythm and normal heart sounds.   Respiratory: Effort normal and breath sounds normal.  GI: Soft. There is no tenderness.  Genitourinary: No bleeding around the vagina. Vaginal discharge: mucusy.  Musculoskeletal: Normal range of motion. She exhibits edema (trace bilat pedal).  Neurological: She is alert and oriented to person, place, and time.  Skin: Skin is warm and dry.    Prenatal labs:  ABO, Rh: --/--/O POS, O POS (11/03 1635) Antibody: NEG (11/03 1635) Rubella: 20.3 (04/24 1547) RPR: NON REAC (09/06 1044)  HBsAg: NEGATIVE (04/24 1547)  HIV: NON REACTIVE (09/06 1044)  GBS: Negative (11/04 0000)   Assessment/Plan: Induction of Labor - Gestational Diabetes Crohn's Disease Gestational Hypertension  Plan: Admit to [employer] Cytotec per vagina Check CBGs every 2 hours  until active labor   [facility] 11/02/2012, 9:34 AM

## 2012-11-02 NOTE — Progress Notes (Signed)
Robin Abbott is a 31 y.o. G3P1011 at [redacted]w[redacted]d   Subjective: Intermittent painful contractions. Asking for IV pain meds, declining epidural.  Objective: BP 130/51  Pulse 84  Temp 98 F (36.7 C) (Oral)  Resp 20  Ht 5\' 3"  (1.6 m)  Wt 98.431 kg (217 lb)  BMI 38.44 kg/m2  LMP 02/09/2012     FHT:  FHR: 130 bpm, variability: moderate,  accelerations:  Present,  decelerations:  Absent UC:   Regular every 2 min. SVE:   Dilation: 4 (BBOW) Effacement (%): 60 Station: -2 Exam by:: Isac Sarna, RN at 4:00pm  Labs: CBG (last 3)   Basename 11/02/12 1352 11/02/12 1135  GLUCAP 81 73   Assessment / Plan: Augmentation of labor, progressing well   Fetal Wellbeing: Category II Pain Control: Labor support with IV Fentanyl  I/D: n/a  Anticipated MOD: NSVD  PILOTO, Shawnna Pancake 11/02/2012, 5:24 PM

## 2012-11-02 NOTE — Progress Notes (Signed)
   Subjective: Pt reports intermittent contractions.  No request for pain medication.    Objective: BP 135/73  Pulse 65  Temp 97.8 F (36.6 C) (Oral)  Resp 18  Ht 5\' 3"  (1.6 m)  Wt 98.431 kg (217 lb)  BMI 38.44 kg/m2  LMP 02/09/2012      FHT:  FHR: 120's bpm, variability: moderate,  accelerations:  Present,  decelerations:  Absent UC:   irregular, every poor tracing minutes SVE:   Dilation: 2 Effacement (%): 50 Station: -3 Exam by:: Isac Sarna, RN  Labs: Lab Results  Component Value Date   WBC 8.9 11/02/2012   HGB 10.8* 11/02/2012   HCT 33.2* 11/02/2012   MCV 91.2 11/02/2012   PLT 205 11/02/2012   Foley Bulb placed without difficulty.  Assessment / Plan: Augmentation of labor, progressing well  Labor: Progressing normally Preeclampsia:  n/a Fetal Wellbeing:  Category I Pain Control:  Labor support without medications I/D:  n/a Anticipated MOD:  NSVD  [facility] 11/02/2012, 2:25 PM

## 2012-11-02 NOTE — H&P (Signed)
Chart reviewed and agree with management and plan.  

## 2012-11-03 ENCOUNTER — Encounter (HOSPITAL_COMMUNITY): Payer: Self-pay

## 2012-11-03 DIAGNOSIS — O139 Gestational [pregnancy-induced] hypertension without significant proteinuria, unspecified trimester: Secondary | ICD-10-CM

## 2012-11-03 DIAGNOSIS — O409XX0 Polyhydramnios, unspecified trimester, not applicable or unspecified: Secondary | ICD-10-CM

## 2012-11-03 DIAGNOSIS — O99814 Abnormal glucose complicating childbirth: Secondary | ICD-10-CM

## 2012-11-03 DIAGNOSIS — K5090 Crohn's disease, unspecified, without complications: Secondary | ICD-10-CM

## 2012-11-03 LAB — CBC
MCH: 29.6 pg (ref 26.0–34.0)
Platelets: 170 10*3/uL (ref 150–400)
RBC: 3.58 MIL/uL — ABNORMAL LOW (ref 3.87–5.11)
WBC: 10.5 10*3/uL (ref 4.0–10.5)

## 2012-11-03 LAB — GLUCOSE, CAPILLARY
Glucose-Capillary: 97 mg/dL (ref 70–99)
Glucose-Capillary: 101 mg/dL — ABNORMAL HIGH (ref 70–99)
Glucose-Capillary: 98 mg/dL (ref 70–99)

## 2012-11-03 LAB — MRSA PCR SCREENING: MRSA by PCR: NEGATIVE

## 2012-11-03 MED ORDER — LACTATED RINGERS IV SOLN: 500.0000 mL | Freq: Once | INTRAVENOUS | Status: DC

## 2012-11-03 MED ORDER — DIPHENHYDRAMINE HCL 25 MG PO CAPS: 25.0000 mg | ORAL_CAPSULE | Freq: Four times a day (QID) | ORAL | Status: DC | PRN

## 2012-11-03 MED ORDER — FENTANYL 2.5 MCG/ML BUPIVACAINE 1/10 % EPIDURAL INFUSION (WH - ANES)
14.0000 mL/h | INTRAMUSCULAR | Status: DC
  Filled 2012-11-03: qty 125

## 2012-11-03 MED ORDER — LANOLIN HYDROUS EX OINT: TOPICAL_OINTMENT | CUTANEOUS | Status: DC | PRN

## 2012-11-03 MED ORDER — LACTATED RINGERS IV SOLN: INTRAVENOUS | Status: DC

## 2012-11-03 MED ORDER — OXYCODONE-ACETAMINOPHEN 5-325 MG PO TABS
1.0000 | ORAL_TABLET | ORAL | Status: DC | PRN
  Administered 2012-11-03 – 2012-11-05 (×4): 1 via ORAL
  Filled 2012-11-03 (×4): qty 1

## 2012-11-03 MED ORDER — PRENATAL MULTIVITAMIN CH
1.0000 | ORAL_TABLET | Freq: Every day | ORAL | Status: DC
  Administered 2012-11-03 – 2012-11-06 (×3): 1 via ORAL
  Filled 2012-11-03 (×3): qty 1

## 2012-11-03 MED ORDER — METOCLOPRAMIDE HCL 10 MG PO TABS: 10.0000 mg | ORAL_TABLET | Freq: Once | ORAL | Status: DC

## 2012-11-03 MED ORDER — FAMOTIDINE 20 MG PO TABS: 40.0000 mg | ORAL_TABLET | Freq: Once | ORAL | Status: DC

## 2012-11-03 MED ORDER — FAMOTIDINE 20 MG PO TABS
40.0000 mg | ORAL_TABLET | Freq: Once | ORAL | Status: AC
  Administered 2012-11-04: 40 mg via ORAL
  Filled 2012-11-03 (×2): qty 1

## 2012-11-03 MED ORDER — EPHEDRINE 5 MG/ML INJ: 10.0000 mg | INTRAVENOUS | Status: DC | PRN

## 2012-11-03 MED ORDER — ONDANSETRON HCL 4 MG PO TABS: 4.0000 mg | ORAL_TABLET | ORAL | Status: DC | PRN

## 2012-11-03 MED ORDER — DIPHENHYDRAMINE HCL 50 MG/ML IJ SOLN: 12.5000 mg | INTRAMUSCULAR | Status: DC | PRN

## 2012-11-03 MED ORDER — PHENYLEPHRINE 40 MCG/ML (10ML) SYRINGE FOR IV PUSH (FOR BLOOD PRESSURE SUPPORT)
80.0000 ug | PREFILLED_SYRINGE | INTRAVENOUS | Status: DC | PRN
  Filled 2012-11-03: qty 5

## 2012-11-03 MED ORDER — ONDANSETRON HCL 4 MG/2ML IJ SOLN: 4.0000 mg | INTRAMUSCULAR | Status: DC | PRN

## 2012-11-03 MED ORDER — METOCLOPRAMIDE HCL 10 MG PO TABS
10.0000 mg | ORAL_TABLET | Freq: Once | ORAL | Status: AC
  Administered 2012-11-04: 10 mg via ORAL
  Filled 2012-11-03: qty 1

## 2012-11-03 MED ORDER — SENNOSIDES-DOCUSATE SODIUM 8.6-50 MG PO TABS
2.0000 | ORAL_TABLET | Freq: Every day | ORAL | Status: DC
  Administered 2012-11-03 – 2012-11-05 (×3): 2 via ORAL

## 2012-11-03 MED ORDER — IBUPROFEN 600 MG PO TABS
600.0000 mg | ORAL_TABLET | Freq: Four times a day (QID) | ORAL | Status: DC
  Administered 2012-11-03 – 2012-11-06 (×8): 600 mg via ORAL
  Filled 2012-11-03 (×8): qty 1

## 2012-11-03 MED ORDER — FENTANYL CITRATE 0.05 MG/ML IJ SOLN
50.0000 ug | Freq: Once | INTRAMUSCULAR | Status: AC
  Administered 2012-11-03: 50 ug via INTRAVENOUS

## 2012-11-03 MED ORDER — SIMETHICONE 80 MG PO CHEW: 80.0000 mg | CHEWABLE_TABLET | ORAL | Status: DC | PRN

## 2012-11-03 MED ORDER — DIBUCAINE 1 % RE OINT
1.0000 "application " | TOPICAL_OINTMENT | RECTAL | Status: DC | PRN
  Administered 2012-11-03: 1 via RECTAL
  Filled 2012-11-03: qty 28

## 2012-11-03 MED ORDER — WITCH HAZEL-GLYCERIN EX PADS
1.0000 "application " | MEDICATED_PAD | CUTANEOUS | Status: DC | PRN
  Administered 2012-11-03: 1 via TOPICAL

## 2012-11-03 MED ORDER — EPHEDRINE 5 MG/ML INJ
10.0000 mg | INTRAVENOUS | Status: DC | PRN
  Filled 2012-11-03: qty 4

## 2012-11-03 MED ORDER — BENZOCAINE-MENTHOL 20-0.5 % EX AERO
1.0000 "application " | INHALATION_SPRAY | CUTANEOUS | Status: DC | PRN
  Administered 2012-11-03: 1 via TOPICAL
  Filled 2012-11-03: qty 56

## 2012-11-03 MED ORDER — PHENYLEPHRINE 40 MCG/ML (10ML) SYRINGE FOR IV PUSH (FOR BLOOD PRESSURE SUPPORT): 80.0000 ug | PREFILLED_SYRINGE | INTRAVENOUS | Status: DC | PRN

## 2012-11-03 MED ORDER — TETANUS-DIPHTH-ACELL PERTUSSIS 5-2.5-18.5 LF-MCG/0.5 IM SUSP: 0.5000 mL | Freq: Once | INTRAMUSCULAR | Status: DC

## 2012-11-03 NOTE — Progress Notes (Signed)
   Subjective: Pt reports increased pain with contractions.  Declines epidural.    Objective: BP 107/51  Pulse 76  Temp 98 F (36.7 C) (Oral)  Resp 16  Ht 5\' 3"  (1.6 m)  Wt 98.431 kg (217 lb)  BMI 38.44 kg/m2  LMP 02/09/2012      FHT:  FHR: 120's bpm, variability: moderate,  accelerations:  Present,  decelerations:  Absent UC:   irregular, every 4-6 minutes  SVE:   Dilation: 4.5 Effacement (%): 60 Station: -2 Exam by:: Roney Marion CNM  Labs: Lab Results  Component Value Date   WBC 8.9 11/02/2012   HGB 10.8* 11/02/2012   HCT 33.2* 11/02/2012   MCV 91.2 11/02/2012   PLT 205 11/02/2012   CBG (last 3)   Basename 11/03/12 0607 11/03/12 0159 11/02/12 2203  GLUCAP 101* 97 88      Pitocin at 16 mu/min Attempt to AROM unsuccessful  Assessment / Plan: Induction of Labor - Gestational Diabetes GBS negative  Labor: Induction of Labor - Gestational Diabetes Preeclampsia:  n/a Fetal Wellbeing:  Category I Pain Control:  IV pain meds I/D:  n/a Anticipated MOD:  NSVD  Continue to increase pitocin and AROM with descent. [facility] 11/03/2012, 6:42 AM

## 2012-11-03 NOTE — Progress Notes (Signed)
31 y.o. yo G37P1011  with undesired fertility,status post vaginal delivery, desires permanent sterilization. Risks and benefits of procedure discussed with patient including permanence of method, bleeding, infection, injury to surrounding organs and need for additional procedures. Risk failure of 0.5-1% with increased risk of ectopic gestation if pregnancy occurs was also discussed with patient.  Following discussion with OR staff regarding performing BTL, I was informed that the case will have to be postponed until the morning secondary to recent postpartum status in the absence of an epidural and need for an 8 hour wait time.  Informed patient that procedure will be performed in the morning and that she needs to remain NPO after midnight. Patient verbalized understanding and all questions were answered.

## 2012-11-03 NOTE — Progress Notes (Signed)
LATE ENTRY OF NOTE   Pt evaluated around 2230. No complaints at that time other than pain with contractions, relieved with Fentanyl IV. Pt reported previous cervical check was "about 4, a few hours ago." FHR tracing was without decels. No change in plans made at that time.  A&P -Augmentation of labor, continuing on Pit -Pt declining epidural, pain controlled with Fentanyl IV -Routine L&D care  Robin Morton, MD PGY-1, [facility]

## 2012-11-03 NOTE — Progress Notes (Signed)
Robin Abbott is a 31 y.o. G3P1011 at [redacted]w[redacted]d admitted for induction of labor due to Gestational diabetes.  Subjective: Screaming/grunting/crying w/ uc's, wants epidural  Objective: BP 128/67  Pulse 78  Temp 97.4 F (36.3 C) (Oral)  Resp 18  Ht 5\' 3"  (1.6 m)  Wt 98.431 kg (217 lb)  BMI 38.44 kg/m2  LMP 02/09/2012      FHT:  FHR: 120 bpm, variability: moderate,  accelerations:  Abscent,  decelerations:  Absent UC:   regular, every 2-3 minutes SVE:   5/90/0, did not feel bag- rn states pt recently felt big gush of fluid- no fluid seen on perineum  Labs: Lab Results  Component Value Date   WBC 10.5 11/03/2012   HGB 10.6* 11/03/2012   HCT 32.4* 11/03/2012   MCV 90.5 11/03/2012   PLT 170 11/03/2012    Assessment / Plan: IOL d/t A2DM, progressing normally on pitocin- anticipating birth soon  Labor: Progressing normally Preeclampsia:  n/a Fetal Wellbeing:  Cat I Pain Control:  Fentanyl I/D:  n/a Anticipated MOD:  NSVD  Marge Duncans 11/03/2012, 12:40 PM

## 2012-11-03 NOTE — Progress Notes (Signed)
   Subjective: Pt sleeping.  Objective: BP 121/47  Pulse 75  Temp 97.6 F (36.4 C) (Oral)  Resp 18  Ht 5\' 3"  (1.6 m)  Wt 98.431 kg (217 lb)  BMI 38.44 kg/m2  LMP 02/09/2012      FHT:  FHR: 120's bpm, variability: moderate,  accelerations:  Present,  decelerations:  Absent UC:   irregular, every 3-5 minutes SVE:   Dilation: 4.5 Effacement (%): 60 Station: -2 Exam by:: L Lamon RN  Labs: Lab Results  Component Value Date   WBC 8.9 11/02/2012   HGB 10.8* 11/02/2012   HCT 33.2* 11/02/2012   MCV 91.2 11/02/2012   PLT 205 11/02/2012    Assessment / Plan: Augmentation of Labor - Gestational Diabetes  Labor: Augmentation of Labor - Gestational Diabetes Preeclampsia:  n/a Fetal Wellbeing:  Category I Pain Control:  Fentanyl I/D:  n/a Anticipated MOD:  NSVD  Encouraged to increase pitocin.  [facility] 11/03/2012, 2:07 AM

## 2012-11-04 LAB — CBC
MCHC: 32.2 g/dL (ref 30.0–36.0)
Platelets: 153 10*3/uL (ref 150–400)
RDW: 13.6 % (ref 11.5–15.5)
WBC: 9.3 10*3/uL (ref 4.0–10.5)

## 2012-11-04 MED ORDER — METOCLOPRAMIDE HCL 10 MG PO TABS
10.0000 mg | ORAL_TABLET | Freq: Once | ORAL | Status: AC
  Administered 2012-11-04: 10 mg via ORAL
  Filled 2012-11-04: qty 1

## 2012-11-04 NOTE — Clinical Social Work Note (Signed)
CSW spoke with MOB briefly.  No current concerns and hx was several years ago.    Patient was referred for history of depression/anxiety. * Referral screened out by Clinical Social Worker because none of the following criteria appear to apply: ~ History of anxiety/depression during this pregnancy, or of post-partum depression. ~ Diagnosis of anxiety and/or depression within last 3 years ~ History of depression due to pregnancy loss/loss of child OR * Patient's symptoms currently being treated with medication and/or therapy.  Please contact the Clinical Social Worker if needs arise, or by the patient's request.  

## 2012-11-04 NOTE — Anesthesia Preprocedure Evaluation (Signed)
Anesthesia Evaluation  Patient identified by MRN, date of birth, ID band Patient awake    Reviewed: Allergy & Precautions, H&P , NPO status , Patient's Chart, lab work & pertinent test results, reviewed documented beta blocker date and time   History of Anesthesia Complications Negative for: history of anesthetic complications  Airway Mallampati: III TM Distance: <3 FB Neck ROM: full    Dental  (+) Missing and Poor Dentition   Pulmonary asthma (seasonal, last inhaler use in April) , former smoker (quit 4/13),  breath sounds clear to auscultation        Cardiovascular hypertension (PIH), On Medications Rhythm:regular Rate:Normal     Neuro/Psych PSYCHIATRIC DISORDERS (anxiety) negative neurological ROS     GI/Hepatic Neg liver ROS, GERD-  Medicated,crohns disease   Endo/Other  diabetes, Gestational, Oral Hypoglycemic AgentsMorbid obesity  Renal/GU negative Renal ROS  negative genitourinary   Musculoskeletal   Abdominal   Peds  Hematology negative hematology ROS (+)   Anesthesia Other Findings   Reproductive/Obstetrics (+) Pregnancy (SVD 11/30 without epidural)                           Anesthesia Physical Anesthesia Plan  ASA: III  Anesthesia Plan: Spinal   Post-op Pain Management:    Induction:   Airway Management Planned:   Additional Equipment:   Intra-op Plan:   Post-operative Plan:   Informed Consent: I have reviewed the patients History and Physical, chart, labs and discussed the procedure including the risks, benefits and alternatives for the proposed anesthesia with the patient or authorized representative who has indicated his/her understanding and acceptance.     Plan Discussed with: Surgeon and CRNA  Anesthesia Plan Comments:         Anesthesia Quick Evaluation

## 2012-11-04 NOTE — Progress Notes (Addendum)
Post Partum Day 1 Subjective: no complaints, up ad lib, voiding, tolerating PO and + flatus  Objective: Blood pressure 107/66, pulse 62, temperature 97.7 F (36.5 C), temperature source Oral, resp. rate 18, height 5\' 3"  (1.6 m), weight 98.431 kg (217 lb), last menstrual period 02/09/2012, SpO2 97.00%, unknown if currently breastfeeding.  Physical Exam:  General: alert, cooperative and no distress Lochia: appropriate Uterine Fundus: firm Incision: n/a DVT Evaluation: No evidence of DVT seen on physical exam. Negative Homan's sign.   Basename 11/03/12 0800 11/02/12 0805  HGB 10.6* 10.8*  HCT 32.4* 33.2*    Assessment/Plan: Plan for discharge tomorrow and Contraception BTL   LOS: 2 days   Robin Abbott H. 11/04/2012, 8:42 AM    Patient desires permanent sterilization.  Other reversible forms of contraception were discussed with patient; she declines all other modalities. Risks of procedure discussed with patient including but not limited to: risk of regret, permanence of method, bleeding, infection, injury to surrounding organs and need for additional procedures.  Failure risk of 0.5-1% with increased risk of ectopic gestation if pregnancy occurs was also discussed with patient.    Pt has history of Chron's disease.  Pt has never had any surgeries or on any medications for the Chron's disease.  Pt understands that there is increased risk of damage to intraabdominal organs due to the scarring and adhesions that can occur with Chron's disease.  If there are dense adhesions there is a chance that the BTL will not be possible.    Patient verbalized understanding of these risks and wants to proceed with sterilization.  Written informed consent obtained.  To OR when ready.

## 2012-11-05 ENCOUNTER — Inpatient Hospital Stay (HOSPITAL_COMMUNITY): Payer: Medicaid Other | Admitting: Anesthesiology

## 2012-11-05 ENCOUNTER — Encounter (HOSPITAL_COMMUNITY): Payer: Self-pay | Admitting: Anesthesiology

## 2012-11-05 ENCOUNTER — Encounter (HOSPITAL_COMMUNITY)
Admission: RE | Disposition: A | Discharge status: Home / Self Care | Payer: Self-pay | Source: Ambulatory Visit | Attending: Family Medicine

## 2012-11-05 DIAGNOSIS — Z302 Encounter for sterilization: Secondary | ICD-10-CM

## 2012-11-05 HISTORY — PX: TUBAL LIGATION: SHX77

## 2012-11-05 SURGERY — LIGATION, FALLOPIAN TUBE, POSTPARTUM
Anesthesia: Spinal | Site: Abdomen | Laterality: Bilateral | Wound class: Clean Contaminated

## 2012-11-05 MED ORDER — ONDANSETRON HCL 4 MG/2ML IJ SOLN
INTRAMUSCULAR | Status: DC | PRN
  Administered 2012-11-05: 4 mg via INTRAVENOUS

## 2012-11-05 MED ORDER — FENTANYL CITRATE 0.05 MG/ML IJ SOLN
INTRAMUSCULAR | Status: AC
  Administered 2012-11-05: 50 ug via INTRAVENOUS
  Filled 2012-11-05: qty 2

## 2012-11-05 MED ORDER — KETOROLAC TROMETHAMINE 30 MG/ML IJ SOLN
INTRAMUSCULAR | Status: AC
  Administered 2012-11-05: 30 mg via INTRAVENOUS
  Filled 2012-11-05: qty 1

## 2012-11-05 MED ORDER — KETOROLAC TROMETHAMINE 30 MG/ML IJ SOLN
15.0000 mg | Freq: Once | INTRAMUSCULAR | Status: AC | PRN
  Administered 2012-11-05: 30 mg via INTRAVENOUS

## 2012-11-05 MED ORDER — METOCLOPRAMIDE HCL 10 MG PO TABS
10.0000 mg | ORAL_TABLET | Freq: Once | ORAL | Status: AC
  Administered 2012-11-05: 10 mg via ORAL
  Filled 2012-11-05: qty 1

## 2012-11-05 MED ORDER — ONDANSETRON HCL 4 MG/2ML IJ SOLN
INTRAMUSCULAR | Status: AC
  Filled 2012-11-05: qty 2

## 2012-11-05 MED ORDER — BUPIVACAINE HCL (PF) 0.25 % IJ SOLN
INTRAMUSCULAR | Status: DC | PRN
  Administered 2012-11-05: 8 mL

## 2012-11-05 MED ORDER — MIDAZOLAM HCL 5 MG/5ML IJ SOLN
INTRAMUSCULAR | Status: DC | PRN
  Administered 2012-11-05 (×4): 1 mg via INTRAVENOUS

## 2012-11-05 MED ORDER — MIDAZOLAM HCL 2 MG/2ML IJ SOLN
INTRAMUSCULAR | Status: AC
  Filled 2012-11-05: qty 2

## 2012-11-05 MED ORDER — 0.9 % SODIUM CHLORIDE (POUR BTL) OPTIME
TOPICAL | Status: DC | PRN
  Administered 2012-11-05: 1000 mL

## 2012-11-05 MED ORDER — FENTANYL CITRATE 0.05 MG/ML IJ SOLN
25.0000 ug | INTRAMUSCULAR | Status: DC | PRN
  Administered 2012-11-05: 50 ug via INTRAVENOUS

## 2012-11-05 MED ORDER — FENTANYL CITRATE 0.05 MG/ML IJ SOLN
INTRAMUSCULAR | Status: DC | PRN
  Administered 2012-11-05 (×2): 50 ug via INTRAVENOUS

## 2012-11-05 MED ORDER — FAMOTIDINE 20 MG PO TABS
40.0000 mg | ORAL_TABLET | Freq: Once | ORAL | Status: AC
  Administered 2012-11-05: 40 mg via ORAL
  Filled 2012-11-05: qty 2

## 2012-11-05 MED ORDER — FENTANYL CITRATE 0.05 MG/ML IJ SOLN
INTRAMUSCULAR | Status: AC
  Filled 2012-11-05: qty 2

## 2012-11-05 MED ORDER — BUPIVACAINE IN DEXTROSE 0.75-8.25 % IT SOLN
INTRATHECAL | Status: DC | PRN
  Administered 2012-11-05: 8.25 mg via INTRATHECAL

## 2012-11-05 MED ORDER — BUPIVACAINE HCL (PF) 0.25 % IJ SOLN
INTRAMUSCULAR | Status: AC
  Filled 2012-11-05: qty 30

## 2012-11-05 MED ORDER — LACTATED RINGERS IV SOLN
INTRAVENOUS | Status: DC
  Administered 2012-11-05 (×2): via INTRAVENOUS

## 2012-11-05 SURGICAL SUPPLY — 19 items
CHLORAPREP W/TINT 26ML (MISCELLANEOUS) ×2 IMPLANT
CLIP FILSHIE TUBAL LIGA STRL (Clip) ×3 IMPLANT
CLOTH BEACON ORANGE TIMEOUT ST (SAFETY) ×2 IMPLANT
DRSG COVADERM PLUS 2X2 (GAUZE/BANDAGES/DRESSINGS) ×1 IMPLANT
GLOVE BIO SURGEON STRL SZ7 (GLOVE) ×2 IMPLANT
GLOVE BIOGEL PI IND STRL 7.0 (GLOVE) ×2 IMPLANT
GLOVE BIOGEL PI INDICATOR 7.0 (GLOVE) ×2
GOWN PREVENTION PLUS LG XLONG (DISPOSABLE) ×4 IMPLANT
NEEDLE HYPO 22GX1.5 SAFETY (NEEDLE) ×2 IMPLANT
NS IRRIG 1000ML POUR BTL (IV SOLUTION) ×2 IMPLANT
PACK ABDOMINAL MINOR (CUSTOM PROCEDURE TRAY) ×2 IMPLANT
SPONGE LAP 4X18 X RAY DECT (DISPOSABLE) ×1 IMPLANT
SUT VIC AB 0 CT1 27 (SUTURE) ×2
SUT VIC AB 0 CT1 27XBRD ANBCTR (SUTURE) ×1 IMPLANT
SUT VICRYL 4-0 PS2 18IN ABS (SUTURE) ×2 IMPLANT
SYR CONTROL 10ML LL (SYRINGE) ×2 IMPLANT
TOWEL OR 17X24 6PK STRL BLUE (TOWEL DISPOSABLE) ×4 IMPLANT
TRAY FOLEY CATH 14FR (SET/KITS/TRAYS/PACK) ×2 IMPLANT
WATER STERILE IRR 1000ML POUR (IV SOLUTION) ×1 IMPLANT

## 2012-11-05 NOTE — Progress Notes (Signed)
Agree with note. Reviewed the procedure and risk and benefit of BTL according to Dr. Bertram Denver note from yesterday and she understands and wishes to proceed. Her questions were answered.  ARNOLD,JAMES 11/05/2012 2:20 PM

## 2012-11-05 NOTE — Progress Notes (Signed)
Post Partum Day 2 Subjective: no complaints, up ad lib, voiding, tolerating PO and no bowel movements yet.  Objective: Blood pressure 114/56, pulse 75, temperature 97.7 F (36.5 C), temperature source Oral, resp. rate 19, height 5\' 3"  (1.6 m), weight 98.431 kg (217 lb), last menstrual period 02/09/2012, SpO2 98.00%, unknown if currently breastfeeding.  Physical Exam:  General: alert, cooperative and no distress Lochia: appropriate Uterine Fundus: firm DVT Evaluation: No evidence of DVT seen on physical exam.   Basename 11/04/12 0821 11/03/12 0800  HGB 9.7* 10.6*  HCT 30.1* 32.4*    Assessment/Plan: Contraception BLT Patient's BLT schedule for 2:45PM today Continue breastfeeding Plans to follow up with [employer]   LOS: 3 days   Robin Abbott 11/05/2012, 6:30 AM

## 2012-11-05 NOTE — Op Note (Signed)
Procedure: Postpartum bilateral tubal ligation Preoperative diagnosis: Undesired fertility Postoperative diagnosis: Tubal sterilization Surgeon: Dr. Scheryl Darter Anesthesia: Spinal by Dr. Dana Allan Estimated blood loss: Negligible Specimen: None Drains: Foley catheter Complications: None Counts: Correct   Patient gave written consent for postpartum bilateral tubal sterilization procedure. Patient identification was confirmed and she was brought to the operating room. She received spinal anesthesia and was placed in the dorsal supine position. Foley catheter was placed. Abdomen was sterilely prepped and draped. Cord percent Marcaine was infiltrated at the umbilicus and #11 blade was used to make a 2 cm transverse infraumbilical skin incision. The fascia and peritoneum were incised with scissors and the peritoneal cavity was exposed. A 2 inch tape was used to pack the omentum and the patient was placed in Trendelenburg position. The right fallopian tube was identified and elevated and traced to its distal fimbriated end. A Filshie clip was applied with good positioning seen at approximately the midportion of the tube. Same was done on the left fallopian tube. Both adnexa appeared normal. The pack was removed. Peritoneum and fascia were closed with running suture with 0 Vicryl. Skin was closed with interrupted subcuticular sutures with 4-0 Vicryl. Sterile dressing was applied. Estimated blood loss was minimal. She tolerated the procedure well without complications. She was brought in stable condition to PACU.  Dr. Scheryl Darter 11/05/2012 3:41 PM

## 2012-11-05 NOTE — Anesthesia Procedure Notes (Signed)
Spinal  Patient location during procedure: OR Start time: 11/05/2012 2:57 PM Staffing Performed by: anesthesiologist  Preanesthetic Checklist Completed: patient identified, site marked, surgical consent, pre-op evaluation, timeout performed, IV checked, risks and benefits discussed and monitors and equipment checked Spinal Block Patient position: sitting Prep: site prepped and draped and DuraPrep Patient monitoring: heart rate, cardiac monitor, continuous pulse ox and blood pressure Approach: midline Location: L3-4 Injection technique: single-shot Needle Needle type: Sprotte  Needle gauge: 24 G Needle length: 9 cm Assessment Sensory level: T6 Additional Notes Clear free flow CSF on first attempt.  No paresthesia.. Patient tolerated procedure well. Jasmine December, MD

## 2012-11-05 NOTE — Progress Notes (Signed)
UR chart review completed.  

## 2012-11-05 NOTE — Anesthesia Postprocedure Evaluation (Signed)
  Anesthesia Post-op Note  Patient: Robin Abbott  Procedure(s) Performed: Procedure(s) (LRB) with comments: POST PARTUM TUBAL LIGATION (Bilateral) - with clips  Patient is awake, responsive, moving her legs, and has signs of resolution of her numbness. Pain and nausea are reasonably well controlled. Vital signs are stable and clinically acceptable. Oxygen saturation is clinically acceptable. There are no apparent anesthetic complications at this time. Patient is ready for discharge.

## 2012-11-05 NOTE — Transfer of Care (Signed)
Immediate Anesthesia Transfer of Care Note  Patient: Robin Abbott  Procedure(s) Performed: Procedure(s) (LRB) with comments: POST PARTUM TUBAL LIGATION (Bilateral) - with clips  Patient Location: PACU  Anesthesia Type:Spinal  Level of Consciousness: awake  Airway & Oxygen Therapy: Patient Spontanous Breathing  Post-op Assessment: Report given to PACU RN and Post -op Vital signs reviewed and stable  Post vital signs: Reviewed  Complications: No apparent anesthesia complications

## 2012-11-06 MED ORDER — IBUPROFEN 600 MG PO TABS: 600.0000 mg | ORAL_TABLET | Freq: Four times a day (QID) | ORAL | Status: DC

## 2012-11-06 MED ORDER — OXYCODONE-ACETAMINOPHEN 5-325 MG PO TABS: 1.0000 | ORAL_TABLET | Freq: Four times a day (QID) | ORAL | Status: DC | PRN

## 2012-11-06 NOTE — Addendum Note (Signed)
Addendum  created 11/06/12 0936 by Earmon Phoenix, CRNA   Modules edited:Charges VN, Notes Section

## 2012-11-06 NOTE — Anesthesia Postprocedure Evaluation (Signed)
  Anesthesia Post-op Note  Patient: Robin Abbott  Procedure(s) Performed: Procedure(s) (LRB) with comments: POST PARTUM TUBAL LIGATION (Bilateral) - with clips  Patient Location: Mother/Baby  Anesthesia Type:Spinal  Level of Consciousness: awake, alert  and oriented  Airway and Oxygen Therapy: Patient Spontanous Breathing  Post-op Pain: mild  Post-op Assessment: Patient's Cardiovascular Status Stable, Respiratory Function Stable, No headache, No backache, No residual numbness and No residual motor weakness  Post-op Vital Signs: stable  Complications: No apparent anesthesia complications

## 2012-11-06 NOTE — Discharge Summary (Signed)
Obstetric Discharge Summary  Robin Abbott is a 31 y.o. [id number] who presented at 39w for IOL due to A2 GDM (on glyburide) and gestational HTN (on Aldomet). She underwent cervical ripening with cytotec and foley bulb and induction with pitocin. She progressed well without an epidural and had a normal spontaneous vaginal delivery and routine post-partum course. Her blood sugars and blood pressures remained well controlled throughout her stay. She desired a bilateral tubal ligation which was schedule for post-partum day #1 but got pushed back due to the OR schedule. The BTL was done on PPD #2 and patient was ready for discharge on PPD #3.  Reason for Admission: induction of labor due to gestational hypertension and GDM Prenatal Procedures: none Intrapartum Procedures: spontaneous vaginal delivery Postpartum Procedures: P.P. tubal ligation Complications-Operative and Postpartum: 2nd degree perineal laceration Hemoglobin  Date Value Range Status  11/04/2012 9.7* 12.0 - 15.0 g/dL Final     HCT  Date Value Range Status  11/04/2012 30.1* 36.0 - 46.0 % Final    Physical Exam:  General: alert, cooperative and no distress Lochia: appropriate Uterine Fundus: firm Incision: healing well, dressing in place clean/dry DVT Evaluation: No evidence of DVT seen on physical exam.  Discharge Diagnoses: Term Pregnancy-delivered  Discharge Information: Date: 11/06/2012 Activity: pelvic rest Diet: routine Medications: Ibuprofen, Percocet and Zantac, Pepcid, Diabeta Condition: stable Instructions: refer to practice specific booklet Discharge to: home Follow-up Information    Follow up with [facility]. In 4 weeks.   Contact information:   [address] [place] [address] [phone]         Newborn Data: Live born female  Birth Weight: 7 lb 15.7 oz (3620 g) APGAR: 8, 9  Home with mother. Breastfeeding. BTL this hospitalization for  contraception.  Street, Christopher 11/06/2012, 7:26 AM   I saw and examined patient and agree with above. Napoleon Form, MD

## 2012-11-07 ENCOUNTER — Encounter (HOSPITAL_COMMUNITY): Payer: Self-pay | Admitting: Obstetrics & Gynecology

## 2012-12-17 ENCOUNTER — Ambulatory Visit: Payer: Medicaid Other | Admitting: Obstetrics & Gynecology

## 2012-12-18 ENCOUNTER — Telehealth: Payer: Self-pay | Admitting: *Deleted

## 2012-12-18 DIAGNOSIS — B372 Candidiasis of skin and nail: Secondary | ICD-10-CM

## 2012-12-18 MED ORDER — FLUCONAZOLE 150 MG PO TABS: 150.0000 mg | ORAL_TABLET | Freq: Once | ORAL | Status: DC

## 2012-12-18 NOTE — Telephone Encounter (Signed)
Patients baby is being treated for thrush, she was advised to be treated with antifungal as well.  Rx for Diflucan called to patients pharmacy.

## 2012-12-20 ENCOUNTER — Ambulatory Visit (INDEPENDENT_AMBULATORY_CARE_PROVIDER_SITE_OTHER): Payer: Medicaid Other | Admitting: Obstetrics & Gynecology

## 2012-12-20 ENCOUNTER — Encounter: Payer: Self-pay | Admitting: Obstetrics & Gynecology

## 2012-12-20 VITALS — BP 118/88 | HR 68 | Ht 64.0 in | Wt 183.0 lb

## 2012-12-20 DIAGNOSIS — O99815 Abnormal glucose complicating the puerperium: Secondary | ICD-10-CM

## 2012-12-20 DIAGNOSIS — O24419 Gestational diabetes mellitus in pregnancy, unspecified control: Secondary | ICD-10-CM

## 2012-12-20 NOTE — Patient Instructions (Signed)
Return for 2 hour diabetes screen soon Annual exam is due after 04/04/13 Return to clinic for any scheduled appointments or for any gynecologic concerns as needed.

## 2012-12-20 NOTE — Progress Notes (Signed)
  Subjective:     Robin Abbott is a 32 y.o. [phone] female who presents for a postpartum visit. She is 6 weeks postpartum following a spontaneous vaginal delivery without anesthesia. I have fully reviewed the prenatal and intrapartum course. The delivery was at 39 gestational weeks after induction of labor for GDM.  Postpartum course has been uncomplicated. Baby's course has been uncomplicated. Baby is feeding by breast. Bleeding no bleeding. Bowel function is normal. Bladder function is normal. Patient is sexually active. Contraception method is tubal ligation. Postpartum depression screening: negative.  The following portions of the patient's history were reviewed and updated as appropriate: allergies, current medications, past family history, past medical history, past social history, past surgical history and problem list.  Review of Systems A comprehensive review of systems was negative.   Objective:    BP 118/88  Pulse 68  Ht 5\' 4"  (1.626 m)  Wt 183 lb (83.008 kg)  BMI 31.41 kg/m2  Breastfeeding? Yes  General:  alert and no distress   Breasts:  inspection negative, no nipple discharge or bleeding, no masses or nodularity palpable  Lungs: clear to auscultation bilaterally  Heart:  regular rate and rhythm  Abdomen: soft, non-tender; bowel sounds normal; no masses,  no organomegaly. BTS incision is well healed.   Vulva:  normal  Vagina: normal vagina  Cervix:  multiparous appearance  Corpus: normal size, contour, position, consistency, mobility, non-tender  Adnexa:  normal adnexa  Rectal Exam: Not performed.        Assessment:    Normal postpartum exam. Pap smear not done at today's visit.   Plan:    1. Contraception: tubal ligation 2. Return for 2 hr GTT soon; annual exam due 04/2013 3. Follow up within a few weeks for 2 hr GTT or as needed.

## 2013-01-04 ENCOUNTER — Other Ambulatory Visit (INDEPENDENT_AMBULATORY_CARE_PROVIDER_SITE_OTHER): Payer: Medicaid Other | Admitting: *Deleted

## 2013-01-04 DIAGNOSIS — Z8632 Personal history of gestational diabetes: Secondary | ICD-10-CM

## 2013-01-05 LAB — GLUCOSE TOLERANCE, 2 HOURS: Glucose, Fasting: 98 mg/dL (ref 70–99)

## 2013-01-07 ENCOUNTER — Encounter: Payer: Self-pay | Admitting: Obstetrics & Gynecology

## 2013-02-25 ENCOUNTER — Encounter (HOSPITAL_COMMUNITY): Payer: Self-pay | Admitting: Emergency Medicine

## 2013-02-25 ENCOUNTER — Emergency Department (HOSPITAL_COMMUNITY): Payer: Medicaid Other

## 2013-02-25 ENCOUNTER — Emergency Department (HOSPITAL_COMMUNITY)
Admission: EM | Admit: 2013-02-25 | Discharge: 2013-02-26 | Disposition: A | Discharge status: Home / Self Care | Payer: Medicaid Other | Attending: Emergency Medicine | Admitting: Emergency Medicine

## 2013-02-25 DIAGNOSIS — K5090 Crohn's disease, unspecified, without complications: Secondary | ICD-10-CM | POA: Insufficient documentation

## 2013-02-25 DIAGNOSIS — Z8632 Personal history of gestational diabetes: Secondary | ICD-10-CM | POA: Insufficient documentation

## 2013-02-25 DIAGNOSIS — R109 Unspecified abdominal pain: Secondary | ICD-10-CM | POA: Insufficient documentation

## 2013-02-25 DIAGNOSIS — Z79899 Other long term (current) drug therapy: Secondary | ICD-10-CM | POA: Insufficient documentation

## 2013-02-25 DIAGNOSIS — R05 Cough: Secondary | ICD-10-CM | POA: Insufficient documentation

## 2013-02-25 DIAGNOSIS — Z8742 Personal history of other diseases of the female genital tract: Secondary | ICD-10-CM | POA: Insufficient documentation

## 2013-02-25 DIAGNOSIS — N12 Tubulo-interstitial nephritis, not specified as acute or chronic: Secondary | ICD-10-CM | POA: Insufficient documentation

## 2013-02-25 DIAGNOSIS — Z87440 Personal history of urinary (tract) infections: Secondary | ICD-10-CM | POA: Insufficient documentation

## 2013-02-25 DIAGNOSIS — Z3202 Encounter for pregnancy test, result negative: Secondary | ICD-10-CM | POA: Insufficient documentation

## 2013-02-25 DIAGNOSIS — Z87891 Personal history of nicotine dependence: Secondary | ICD-10-CM | POA: Insufficient documentation

## 2013-02-25 DIAGNOSIS — R059 Cough, unspecified: Secondary | ICD-10-CM | POA: Insufficient documentation

## 2013-02-25 DIAGNOSIS — Z8614 Personal history of Methicillin resistant Staphylococcus aureus infection: Secondary | ICD-10-CM | POA: Insufficient documentation

## 2013-02-25 DIAGNOSIS — Z8659 Personal history of other mental and behavioral disorders: Secondary | ICD-10-CM | POA: Insufficient documentation

## 2013-02-25 DIAGNOSIS — N189 Chronic kidney disease, unspecified: Secondary | ICD-10-CM | POA: Insufficient documentation

## 2013-02-25 DIAGNOSIS — J45909 Unspecified asthma, uncomplicated: Secondary | ICD-10-CM | POA: Insufficient documentation

## 2013-02-25 DIAGNOSIS — R51 Headache: Secondary | ICD-10-CM | POA: Insufficient documentation

## 2013-02-25 DIAGNOSIS — R0602 Shortness of breath: Secondary | ICD-10-CM | POA: Insufficient documentation

## 2013-02-25 DIAGNOSIS — R110 Nausea: Secondary | ICD-10-CM | POA: Insufficient documentation

## 2013-02-25 DIAGNOSIS — R42 Dizziness and giddiness: Secondary | ICD-10-CM | POA: Insufficient documentation

## 2013-02-25 LAB — CBC WITH DIFFERENTIAL/PLATELET
Basophils Relative: 0 % (ref 0–1)
Eosinophils Absolute: 0.1 10*3/uL (ref 0.0–0.7)
Eosinophils Relative: 1 % (ref 0–5)
Hemoglobin: 12.8 g/dL (ref 12.0–15.0)
Lymphs Abs: 1.5 10*3/uL (ref 0.7–4.0)
MCH: 29.8 pg (ref 26.0–34.0)
MCHC: 35.1 g/dL (ref 30.0–36.0)
MCV: 85.1 fL (ref 78.0–100.0)
Monocytes Relative: 6 % (ref 3–12)
Platelets: 227 10*3/uL (ref 150–400)
RBC: 4.29 MIL/uL (ref 3.87–5.11)

## 2013-02-25 LAB — URINALYSIS, ROUTINE W REFLEX MICROSCOPIC
Bilirubin Urine: NEGATIVE
Ketones, ur: NEGATIVE mg/dL
Protein, ur: NEGATIVE mg/dL
Urobilinogen, UA: 1 mg/dL (ref 0.0–1.0)

## 2013-02-25 LAB — BASIC METABOLIC PANEL
BUN: 12 mg/dL (ref 6–23)
Calcium: 9.2 mg/dL (ref 8.4–10.5)
GFR calc Af Amer: 88 mL/min — ABNORMAL LOW (ref >90)
GFR calc non Af Amer: 76 mL/min — ABNORMAL LOW (ref >90)
Glucose, Bld: 103 mg/dL — ABNORMAL HIGH (ref 70–99)

## 2013-02-25 LAB — URINE MICROSCOPIC-ADD ON

## 2013-02-25 MED ORDER — SODIUM CHLORIDE 0.9 % IV BOLUS (SEPSIS)
1000.0000 mL | Freq: Once | INTRAVENOUS | Status: AC
  Administered 2013-02-25: 1000 mL via INTRAVENOUS

## 2013-02-25 MED ORDER — ONDANSETRON HCL 4 MG/2ML IJ SOLN
4.0000 mg | Freq: Once | INTRAMUSCULAR | Status: AC
  Administered 2013-02-25: 4 mg via INTRAVENOUS
  Filled 2013-02-25: qty 2

## 2013-02-25 MED ORDER — ACETAMINOPHEN 325 MG PO TABS
650.0000 mg | ORAL_TABLET | Freq: Once | ORAL | Status: AC
  Administered 2013-02-25: 650 mg via ORAL
  Filled 2013-02-25: qty 2

## 2013-02-25 MED ORDER — DEXTROSE 5 % IV SOLN
1.0000 g | Freq: Once | INTRAVENOUS | Status: AC
  Administered 2013-02-25: 1 g via INTRAVENOUS
  Filled 2013-02-25: qty 10

## 2013-02-25 NOTE — ED Provider Notes (Signed)
History     CSN: [id number]  Arrival date & time 02/25/13  1630   First MD Initiated Contact with Patient 02/25/13 2150      Chief Complaint  Patient presents with  . Abdominal Pain  . Fever  . Cough    (Consider location/radiation/quality/duration/timing/severity/associated sxs/prior treatment) HPI 32 y.o. Female with fever chills lightheaded began today.  STates sore throat Thursday,cough productive of green sputum on Wednedsay, then resolved.  Cough and sob today.  Patient feels very cold.  She states headache and nauseated now but has not vomited or had diarrhea.  44 month old with uri symptoms with cough, 32 y.o. With similar symptoms last week. Denies crohn's symptoms of pain and diarrhea. Denies wheezing. LMP last week.  Denies uti symptoms, denies pelvic discharge.  pmd none.  Past Medical History  Diagnosis Date  . Hypertension in pregnancy 2009  . Hx MRSA infection 2010    tested last year for mrsa and it was negative.  . Crohn's disease   . Asthma   . Pregnancy induced hypertension   . Anxiety   . Abnormal Pap smear     at age 66  . Urinary tract infection   . Gestational diabetes     glyburide  . Chronic kidney disease     pyelonephritis during pregnancy    Past Surgical History  Procedure Laterality Date  . Ankle surgery    . Tonsillectomy    . Colposcopy      abnormal pap  . Wisdom tooth extraction      x 4  . Knee surgery    . Tubal ligation  11/05/2012    Procedure: POST PARTUM TUBAL LIGATION;  Surgeon: Adam Phenix, MD;  Location: [facility];  Service: Gynecology;  Laterality: Bilateral;  with clips    Family History  Problem Relation Age of Onset  . Diabetes Mother   . Hypertension Mother   . COPD Mother   . Depression Mother   . Anesthesia problems Mother     difficulty waking up and nausea and vomitting  . Hypertension Father   . Sleep apnea Father   . Diabetes Sister   . Heart disease Paternal Uncle   . Diabetes Maternal Grandmother   .  Diabetes Maternal Grandfather   . Cancer Paternal Grandmother     cervical    History  Substance Use Topics  . Smoking status: Former Smoker    Quit date: 03/05/2012  . Smokeless tobacco: Never Used  . Alcohol Use: No    OB History   Grav Para Term Preterm Abortions TAB SAB Ect Mult Living   3 2 2  0 1 0 1 0 0 2      Review of Systems  All other systems reviewed and are negative.    Allergies  Hydrocodone and Tetracyclines & related  Home Medications   Current Outpatient Rx  Name  Route  Sig  Dispense  Refill  . albuterol (PROVENTIL HFA;VENTOLIN HFA) 108 (90 BASE) MCG/ACT inhaler   Inhalation   Inhale 2 puffs into the lungs every 6 (six) hours as needed. Rescue inhaler         . Aspirin-Salicylamide-Caffeine (BC HEADACHE POWDER PO)   Oral   Take 1 packet by mouth daily as needed (for headache).           BP 130/78  Pulse 101  Temp(Src) 99.1 F (37.3 C) (Oral)  Resp 20  SpO2 99%  LMP 02/16/2013  Physical Exam  Nursing note and vitals reviewed. Constitutional: She appears well-developed and well-nourished.  HENT:  Head: Normocephalic and atraumatic.  Eyes: Conjunctivae and EOM are normal. Pupils are equal, round, and reactive to light.  Neck: Normal range of motion. Neck supple.  Cardiovascular: Normal rate, regular rhythm, normal heart sounds and intact distal pulses.   Pulmonary/Chest: Effort normal and breath sounds normal.  Abdominal: Soft. Bowel sounds are normal.  Musculoskeletal: Normal range of motion.  Neurological: She is alert.  Skin: Skin is warm and dry.  Psychiatric: She has a normal mood and affect. Thought content normal.    ED Course  Procedures (including critical care time)  Labs Reviewed  CBC WITH DIFFERENTIAL - Abnormal; Notable for the following:    WBC 11.4 (*)    Neutrophils Relative 80 (*)    Neutro Abs 9.2 (*)    All other components within normal limits  BASIC METABOLIC PANEL - Abnormal; Notable for the following:     Glucose, Bld 103 (*)    GFR calc non Af Amer 76 (*)    GFR calc Af Amer 88 (*)    All other components within normal limits  URINALYSIS, ROUTINE W REFLEX MICROSCOPIC - Abnormal; Notable for the following:    APPearance CLOUDY (*)    Hgb urine dipstick SMALL (*)    Nitrite POSITIVE (*)    Leukocytes, UA MODERATE (*)    All other components within normal limits  URINE MICROSCOPIC-ADD ON - Abnormal; Notable for the following:    Squamous Epithelial / LPF FEW (*)    Bacteria, UA MANY (*)    All other components within normal limits  URINE CULTURE  POCT PREGNANCY, URINE   Dg Chest 2 View  02/25/2013  *RADIOLOGY REPORT*  Clinical Data: Chest pain and fever.  Chills.  CHEST - 2 VIEW  Comparison: 03/16/2012  Findings: The heart size and pulmonary vascularity are normal and the lungs are clear.  No osseous abnormality.  IMPRESSION: Normal exam.   Original Report Authenticated By: Francene Boyers, M.D.      No diagnosis found.    MDM  Patient feels improved she has received 1 L of normal saline. She'll receive an additional liter of normal saline. She received 1 g of Rocephin IV. She'll have an oral fluid challenge.   Patient's care discussed with Dr. Verl Bangs and he will reassess her disposition.   Hilario Quarry, MD 03/01/13 [phone]

## 2013-02-25 NOTE — ED Notes (Signed)
Onset 4 days ago sore throat, productive cough green 2 days ago, general body achy. fever, abdominal pain 5/10 achy with nausea.

## 2013-02-25 NOTE — ED Notes (Signed)
Pt vomited x1.  

## 2013-02-25 NOTE — ED Notes (Signed)
Pt c/o right flank pain, fever, nausea. Denies vomiting or diarrhea. Pt c/o general aches and pains, sore throat, headache.

## 2013-02-26 MED ORDER — ONDANSETRON 4 MG PO TBDP: 4.0000 mg | ORAL_TABLET | Freq: Once | ORAL | Status: DC

## 2013-02-26 MED ORDER — SODIUM CHLORIDE 0.9 % IV SOLN
1000.0000 mL | Freq: Once | INTRAVENOUS | Status: AC
  Administered 2013-02-26: 1000 mL via INTRAVENOUS

## 2013-02-26 MED ORDER — ONDANSETRON 8 MG PO TBDP: 8.0000 mg | ORAL_TABLET | Freq: Three times a day (TID) | ORAL | Status: DC | PRN

## 2013-02-26 MED ORDER — SODIUM CHLORIDE 0.9 % IV SOLN: 1000.0000 mL | INTRAVENOUS | Status: DC

## 2013-02-26 MED ORDER — CEPHALEXIN 250 MG PO CAPS: 250.0000 mg | ORAL_CAPSULE | Freq: Four times a day (QID) | ORAL | Status: DC

## 2013-02-26 NOTE — ED Provider Notes (Signed)
  Physical Exam  BP 116/59  Pulse 80  Temp(Src) 100.3 F (37.9 C) (Oral)  Resp 22  SpO2 98%  LMP 02/16/2013  Physical Exam No abd tenderness.    ED Course  Procedures  MDM Improved.  Defervesced.  Tol po.  Will dc to oupt fu,  Ret new/worsening sxs      Robin Iglesia Lytle Michaels, MD 02/26/13 [phone]

## 2013-02-27 LAB — URINE CULTURE: Colony Count: >=100000

## 2013-02-28 ENCOUNTER — Telehealth (HOSPITAL_COMMUNITY): Payer: Self-pay | Admitting: Emergency Medicine

## 2013-02-28 NOTE — ED Notes (Signed)
Positive urnc- treated per protocol.  

## 2013-07-24 ENCOUNTER — Other Ambulatory Visit: Payer: Self-pay | Admitting: Urology

## 2013-08-06 ENCOUNTER — Encounter (HOSPITAL_BASED_OUTPATIENT_CLINIC_OR_DEPARTMENT_OTHER): Payer: Self-pay | Admitting: *Deleted

## 2013-08-06 NOTE — Progress Notes (Signed)
NPO AFTER MN WITH EXCEPTION CLEAR LIQUIDS UNTIL 0700 (NO CREAM/ MILK PROCUCTS). ARRIVES AT 1130. NEEDS ISTAT 8. MAY TAKE TRAMADOL / ZOFRAN IF NEEDED AM OF SURG W/ SIP OF WATER.

## 2013-08-09 ENCOUNTER — Encounter (HOSPITAL_BASED_OUTPATIENT_CLINIC_OR_DEPARTMENT_OTHER): Payer: Self-pay | Admitting: *Deleted

## 2013-08-14 ENCOUNTER — Ambulatory Visit (HOSPITAL_BASED_OUTPATIENT_CLINIC_OR_DEPARTMENT_OTHER)
Admission: RE | Admit: 2013-08-14 | Discharge: 2013-08-14 | Disposition: A | Discharge status: Home / Self Care | Payer: Medicaid Other | Source: Ambulatory Visit | Attending: Urology | Admitting: Urology

## 2013-08-14 ENCOUNTER — Encounter (HOSPITAL_BASED_OUTPATIENT_CLINIC_OR_DEPARTMENT_OTHER)
Admission: RE | Disposition: A | Discharge status: Home / Self Care | Payer: Self-pay | Source: Ambulatory Visit | Attending: Urology

## 2013-08-14 ENCOUNTER — Ambulatory Visit (HOSPITAL_BASED_OUTPATIENT_CLINIC_OR_DEPARTMENT_OTHER): Payer: Medicaid Other | Admitting: Anesthesiology

## 2013-08-14 ENCOUNTER — Encounter (HOSPITAL_BASED_OUTPATIENT_CLINIC_OR_DEPARTMENT_OTHER): Payer: Self-pay | Admitting: *Deleted

## 2013-08-14 ENCOUNTER — Ambulatory Visit (HOSPITAL_COMMUNITY): Payer: Medicaid Other

## 2013-08-14 ENCOUNTER — Encounter (HOSPITAL_BASED_OUTPATIENT_CLINIC_OR_DEPARTMENT_OTHER): Payer: Self-pay | Admitting: Anesthesiology

## 2013-08-14 DIAGNOSIS — N200 Calculus of kidney: Secondary | ICD-10-CM | POA: Insufficient documentation

## 2013-08-14 DIAGNOSIS — K5090 Crohn's disease, unspecified, without complications: Secondary | ICD-10-CM | POA: Insufficient documentation

## 2013-08-14 DIAGNOSIS — K219 Gastro-esophageal reflux disease without esophagitis: Secondary | ICD-10-CM | POA: Insufficient documentation

## 2013-08-14 DIAGNOSIS — J45909 Unspecified asthma, uncomplicated: Secondary | ICD-10-CM | POA: Insufficient documentation

## 2013-08-14 DIAGNOSIS — N201 Calculus of ureter: Secondary | ICD-10-CM | POA: Insufficient documentation

## 2013-08-14 HISTORY — DX: Adverse effect of unspecified anesthetic, initial encounter: T41.45XA

## 2013-08-14 HISTORY — DX: Other complications of anesthesia, initial encounter: T88.59XA

## 2013-08-14 HISTORY — DX: Calculus of ureter: N20.1

## 2013-08-14 HISTORY — PX: CYSTOSCOPY WITH RETROGRADE PYELOGRAM, URETEROSCOPY AND STENT PLACEMENT: SHX5789

## 2013-08-14 HISTORY — DX: Gastro-esophageal reflux disease without esophagitis: K21.9

## 2013-08-14 HISTORY — DX: Urgency of urination: R39.15

## 2013-08-14 HISTORY — PX: HOLMIUM LASER APPLICATION: SHX5852

## 2013-08-14 HISTORY — PX: CYSTOSCOPY WITH STENT PLACEMENT: SHX5790

## 2013-08-14 HISTORY — DX: Personal history of urinary calculi: Z87.442

## 2013-08-14 LAB — POCT I-STAT, CHEM 8
Calcium, Ion: 1.23 mmol/L (ref 1.12–1.23)
Chloride: 107 mEq/L (ref 96–112)
Glucose, Bld: 94 mg/dL (ref 70–99)
HCT: 40 % (ref 36.0–46.0)
TCO2: 23 mmol/L (ref 0–100)

## 2013-08-14 SURGERY — CYSTOURETEROSCOPY, WITH RETROGRADE PYELOGRAM AND STENT INSERTION
Anesthesia: General | Site: Ureter | Laterality: Left | Wound class: Clean Contaminated

## 2013-08-14 MED ORDER — FENTANYL CITRATE 0.05 MG/ML IJ SOLN
INTRAMUSCULAR | Status: DC | PRN
  Administered 2013-08-14 (×2): 50 ug via INTRAVENOUS

## 2013-08-14 MED ORDER — GENTAMICIN SULFATE 40 MG/ML IJ SOLN
350.0000 mg | INTRAVENOUS | Status: DC
  Administered 2013-08-14: 350 mg via INTRAVENOUS
  Filled 2013-08-14: qty 8.75

## 2013-08-14 MED ORDER — SENNOSIDES-DOCUSATE SODIUM 8.6-50 MG PO TABS: 1.0000 | ORAL_TABLET | Freq: Two times a day (BID) | ORAL | Status: DC

## 2013-08-14 MED ORDER — SULFAMETHOXAZOLE-TMP DS 800-160 MG PO TABS: 1.0000 | ORAL_TABLET | Freq: Two times a day (BID) | ORAL | Status: DC

## 2013-08-14 MED ORDER — ONDANSETRON HCL 4 MG/2ML IJ SOLN
INTRAMUSCULAR | Status: DC | PRN
  Administered 2013-08-14: 4 mg via INTRAVENOUS

## 2013-08-14 MED ORDER — DEXAMETHASONE SODIUM PHOSPHATE 4 MG/ML IJ SOLN
INTRAMUSCULAR | Status: DC | PRN
  Administered 2013-08-14: 10 mg via INTRAVENOUS

## 2013-08-14 MED ORDER — FENTANYL CITRATE 0.05 MG/ML IJ SOLN
25.0000 ug | INTRAMUSCULAR | Status: DC | PRN
  Administered 2013-08-14: 25 ug via INTRAVENOUS
  Filled 2013-08-14: qty 1

## 2013-08-14 MED ORDER — PROPOFOL 10 MG/ML IV BOLUS
INTRAVENOUS | Status: DC | PRN
  Administered 2013-08-14: 200 mg via INTRAVENOUS

## 2013-08-14 MED ORDER — METOCLOPRAMIDE HCL 5 MG/ML IJ SOLN
INTRAMUSCULAR | Status: DC | PRN
  Administered 2013-08-14: 10 mg via INTRAVENOUS

## 2013-08-14 MED ORDER — KETOROLAC TROMETHAMINE 30 MG/ML IJ SOLN
INTRAMUSCULAR | Status: DC | PRN
  Administered 2013-08-14: 30 mg via INTRAVENOUS

## 2013-08-14 MED ORDER — IOHEXOL 350 MG/ML SOLN
INTRAVENOUS | Status: DC | PRN
  Administered 2013-08-14: 8 mL

## 2013-08-14 MED ORDER — TRAMADOL HCL 50 MG PO TABS: ORAL_TABLET | ORAL | Status: DC

## 2013-08-14 MED ORDER — LIDOCAINE HCL (CARDIAC) 20 MG/ML IV SOLN
INTRAVENOUS | Status: DC | PRN
  Administered 2013-08-14: 80 mg via INTRAVENOUS

## 2013-08-14 MED ORDER — MIDAZOLAM HCL 5 MG/5ML IJ SOLN
INTRAMUSCULAR | Status: DC | PRN
  Administered 2013-08-14: 2 mg via INTRAVENOUS

## 2013-08-14 MED ORDER — KETOROLAC TROMETHAMINE 30 MG/ML IJ SOLN
15.0000 mg | Freq: Once | INTRAMUSCULAR | Status: DC | PRN
  Filled 2013-08-14: qty 1

## 2013-08-14 MED ORDER — PROMETHAZINE HCL 25 MG/ML IJ SOLN
6.2500 mg | INTRAMUSCULAR | Status: DC | PRN
  Filled 2013-08-14: qty 1

## 2013-08-14 MED ORDER — SODIUM CHLORIDE 0.9 % IR SOLN
Status: DC | PRN
  Administered 2013-08-14: 6000 mL

## 2013-08-14 MED ORDER — LACTATED RINGERS IV SOLN
INTRAVENOUS | Status: DC
  Administered 2013-08-14 (×2): via INTRAVENOUS
  Filled 2013-08-14: qty 1000

## 2013-08-14 MED ORDER — GENTAMICIN IN SALINE 1.6-0.9 MG/ML-% IV SOLN
80.0000 mg | INTRAVENOUS | Status: DC
  Filled 2013-08-14: qty 50

## 2013-08-14 SURGICAL SUPPLY — 44 items
ADAPTER CATH URET PLST 4-6FR (CATHETERS) IMPLANT
ADPR CATH URET STRL DISP 4-6FR (CATHETERS)
BAG DRAIN URO-CYSTO SKYTR STRL (DRAIN) ×2 IMPLANT
BAG DRN UROCATH (DRAIN) ×1
BASKET LASER NITINOL 1.9FR (BASKET) ×2 IMPLANT
BASKET STNLS GEMINI 4WIRE 3FR (BASKET) IMPLANT
BASKET ZERO TIP NITINOL 2.4FR (BASKET) IMPLANT
BRUSH URET BIOPSY 3F (UROLOGICAL SUPPLIES) IMPLANT
BSKT STON RTRVL 120 1.9FR (BASKET) ×1
BSKT STON RTRVL GEM 120X11 3FR (BASKET)
BSKT STON RTRVL ZERO TP 2.4FR (BASKET)
CANISTER SUCT LVC 12 LTR MEDI- (MISCELLANEOUS) ×1 IMPLANT
CATH FOLEY 2WAY  3CC  8FR (CATHETERS)
CATH FOLEY 2WAY 3CC 8FR (CATHETERS) IMPLANT
CATH INTERMIT  6FR 70CM (CATHETERS) ×1 IMPLANT
CATH URET 5FR 28IN CONE TIP (BALLOONS)
CATH URET 5FR 28IN OPEN ENDED (CATHETERS) IMPLANT
CATH URET 5FR 70CM CONE TIP (BALLOONS) IMPLANT
CLOTH BEACON ORANGE TIMEOUT ST (SAFETY) ×2 IMPLANT
DRAPE CAMERA CLOSED 9X96 (DRAPES) ×2 IMPLANT
ELECT REM PT RETURN 9FT ADLT (ELECTROSURGICAL)
ELECTRODE REM PT RTRN 9FT ADLT (ELECTROSURGICAL) IMPLANT
GLOVE BIO SURGEON STRL SZ 6 (GLOVE) ×1 IMPLANT
GLOVE BIO SURGEON STRL SZ 6.5 (GLOVE) ×1 IMPLANT
GLOVE BIO SURGEON STRL SZ7.5 (GLOVE) ×2 IMPLANT
GOWN PREVENTION PLUS LG XLONG (DISPOSABLE) ×3 IMPLANT
GOWN STRL REIN XL XLG (GOWN DISPOSABLE) ×2 IMPLANT
GUIDEWIRE 0.038 PTFE COATED (WIRE) IMPLANT
GUIDEWIRE ANG ZIPWIRE 038X150 (WIRE) ×2 IMPLANT
GUIDEWIRE STR DUAL SENSOR (WIRE) ×2 IMPLANT
IV NS IRRIG 3000ML ARTHROMATIC (IV SOLUTION) ×4 IMPLANT
KIT BALLIN UROMAX 15FX10 (LABEL) IMPLANT
KIT BALLN UROMAX 15FX4 (MISCELLANEOUS) IMPLANT
KIT BALLN UROMAX 26 75X4 (MISCELLANEOUS)
LASER FIBER DISP (UROLOGICAL SUPPLIES) ×1 IMPLANT
PACK CYSTOSCOPY (CUSTOM PROCEDURE TRAY) ×2 IMPLANT
SET HIGH PRES BAL DIL (LABEL)
SHEATH ACCESS URETERAL 24CM (SHEATH) ×1 IMPLANT
SHEATH URET ACCESS 12FR/35CM (UROLOGICAL SUPPLIES) IMPLANT
SHEATH URET ACCESS 12FR/55CM (UROLOGICAL SUPPLIES) IMPLANT
STENT URET 6FRX24 CONTOUR (STENTS) ×1 IMPLANT
SYRINGE 10CC LL (SYRINGE) ×2 IMPLANT
SYRINGE IRR TOOMEY STRL 70CC (SYRINGE) IMPLANT
TUBE FEEDING 8FR 16IN STR KANG (MISCELLANEOUS) ×1 IMPLANT

## 2013-08-14 NOTE — Anesthesia Preprocedure Evaluation (Signed)
Anesthesia Evaluation  Patient identified by MRN, date of birth, ID band Patient awake    Reviewed: Allergy & Precautions, H&P , NPO status , Patient's Chart, lab work & pertinent test results  Airway Mallampati: II TM Distance: >3 FB Neck ROM: Full    Dental no notable dental hx.    Pulmonary asthma , Current Smoker,  breath sounds clear to auscultation  Pulmonary exam normal       Cardiovascular negative cardio ROS  Rhythm:Regular Rate:Normal     Neuro/Psych negative neurological ROS  negative psych ROS   GI/Hepatic negative GI ROS, Neg liver ROS,   Endo/Other  Morbid obesity  Renal/GU negative Renal ROS  negative genitourinary   Musculoskeletal negative musculoskeletal ROS (+)   Abdominal   Peds negative pediatric ROS (+)  Hematology negative hematology ROS (+)   Anesthesia Other Findings   Reproductive/Obstetrics negative OB ROS                           Anesthesia Physical Anesthesia Plan  ASA: II  Anesthesia Plan: General   Post-op Pain Management:    Induction: Intravenous  Airway Management Planned: LMA  Additional Equipment:   Intra-op Plan:   Post-operative Plan:   Informed Consent: I have reviewed the patients History and Physical, chart, labs and discussed the procedure including the risks, benefits and alternatives for the proposed anesthesia with the patient or authorized representative who has indicated his/her understanding and acceptance.   Dental advisory given  Plan Discussed with: CRNA and Surgeon  Anesthesia Plan Comments:         Anesthesia Quick Evaluation

## 2013-08-14 NOTE — Transfer of Care (Signed)
Immediate Anesthesia Transfer of Care Note  Patient: Robin Abbott  Procedure(s) Performed: Procedure(s) (LRB): CYSTOSCOPY WITH RETROGRADE PYELOGRAM, URETEROSCOPY , dilation cayal seal (Left) HOLMIUM LASER APPLICATION (Left) CYSTOSCOPY WITH STENT PLACEMENT (Left)  Patient Location: PACU  Anesthesia Type: General  Level of Consciousness: awake, alert  and oriented  Airway & Oxygen Therapy: Patient Spontanous Breathing and Patient connected to face mask oxygen  Post-op Assessment: Report given to PACU RN and Post -op Vital signs reviewed and stable  Post vital signs: Reviewed and stable  Complications: No apparent anesthesia complications

## 2013-08-14 NOTE — Anesthesia Procedure Notes (Signed)
Procedure Name: LMA Insertion Date/Time: 08/14/2013 1:53 PM Performed by: Norva Pavlov Pre-anesthesia Checklist: Patient identified, Emergency Drugs available, Suction available and Patient being monitored Patient Re-evaluated:Patient Re-evaluated prior to inductionOxygen Delivery Method: Circle System Utilized Preoxygenation: Pre-oxygenation with 100% oxygen Intubation Type: IV induction Ventilation: Mask ventilation without difficulty LMA: LMA inserted LMA Size: 4.0 Number of attempts: 1 Airway Equipment and Method: bite block Placement Confirmation: positive ETCO2 Tube secured with: Tape Dental Injury: Teeth and Oropharynx as per pre-operative assessment

## 2013-08-14 NOTE — H&P (Signed)
Robin Abbott is an 32 y.o. female.    Chief Complaint: Pre-Op Left Ureteroscopic Stone Manipulation  HPI:   1 - Nephrolithiasis - Pt seen at ER 8/15 with left 5mm ureteral + upper pole 7mm with mod hydro and right sided punctate stones on w/u colickly left abdominal pain. No prior stones. Pain now well controlled on tramadol + tamsulosin + promethazine.  PMH sig for orhto surgery, tubal, appy. No CV disease. No blood thinners.   Today Robin Abbott is seen to proceed with left ureteroscopic stone manipulation. Most recent UCX negative.  Past Medical History  Diagnosis Date  . Crohn's disease   . Asthma   . Anxiety   . History of kidney stones     DURING PRENGANCY  . Left ureteral calculus   . GERD (gastroesophageal reflux disease)   . Urgency of urination   . Complication of anesthesia     HARD TO WAKE POST T & A , AGE 43    Past Surgical History  Procedure Laterality Date  . Tubal ligation  11/05/2012    Procedure: POST PARTUM TUBAL LIGATION;  Surgeon: Adam Phenix, MD;  Location: [facility];  Service: Gynecology;  Laterality: Bilateral;  with clips  . Knee arthroscopy  11-18-1999    AND OPEN EXCISION OF JUMPERS KNEE W/ DRILLING INFERIOR POLE OF PATELLA  . Orif left ankle fx  02-26-2008  . Tonsillectomy and adenoidectomy  AGE 43    Family History  Problem Relation Age of Onset  . Diabetes Mother   . Hypertension Mother   . COPD Mother   . Depression Mother   . Anesthesia problems Mother     difficulty waking up and nausea and vomitting  . Hypertension Father   . Sleep apnea Father   . Diabetes Sister   . Heart disease Paternal Uncle   . Diabetes Maternal Grandmother   . Diabetes Maternal Grandfather   . Cancer Paternal Grandmother     cervical   Social History:  reports that she has been smoking Cigarettes.  She has a 2 pack-year smoking history. She has never used smokeless tobacco. She reports that she does not drink alcohol or use illicit drugs.  Allergies:   Allergies  Allergen Reactions  . Hydrocodone Hives  . Keflex [Cephalexin] Other (See Comments)    "MOUTH AND NOSE ULCERS" / THRUSH  . Tetracyclines & Related Nausea And Vomiting    No prescriptions prior to admission    No results found for this or any previous visit (from the past 48 hour(s)). No results found.  Review of Systems  Constitutional: Negative.  Negative for fever and chills.  HENT: Negative.   Eyes: Negative.   Respiratory: Negative.   Cardiovascular: Negative.   Gastrointestinal: Negative.   Genitourinary: Positive for flank pain.  Musculoskeletal: Negative.   Skin: Negative.   Neurological: Negative.   Endo/Heme/Allergies: Negative.   Psychiatric/Behavioral: Negative.     Height 5\' 4"  (1.626 m), weight 90.719 kg (200 lb), last menstrual period 08/02/2013, not currently breastfeeding. Physical Exam  Constitutional: She is oriented to person, place, and time. She appears well-developed and well-nourished.  HENT:  Head: Normocephalic and atraumatic.  Eyes: Pupils are equal, round, and reactive to light.  Neck: Normal range of motion. Neck supple.  Cardiovascular: Normal rate and regular rhythm.   Respiratory: Effort normal and breath sounds normal.  GI: Soft. Bowel sounds are normal.  Genitourinary:  Mild Left CVAT  Musculoskeletal: Normal range of motion.  Neurological: She  is alert and oriented to person, place, and time.  Skin: Skin is warm and dry.  Psychiatric: She has a normal mood and affect. Her behavior is normal. Judgment and thought content normal.     Assessment/Plan   1 - Nephrolithiasis - Multifocal left sided stones, favor URS as could completely clean out this side. Rt side only punctate, prefer observation.  We rediscussed ureteroscopic stone manipulation with basketing and laser-lithotripsy in detail.  We rediscussed risks including bleeding, infection, damage to kidney / ureter  bladder, rarely loss of kidney. We rediscussed  anesthetic risks and rare but serious surgical complications including DVT, PE, MI, and mortality. We specifically readdressed that in 5-10% of cases a staged approach is required with stenting followed by re-attempt ureteroscopy if anatomy unfavorable. The patient voiced understanding and wises to proceed.     Robin Abbott 08/14/2013, 5:47 AM

## 2013-08-14 NOTE — Brief Op Note (Signed)
08/14/2013  2:33 PM  PATIENT:  Robin Abbott  32 y.o. female  PRE-OPERATIVE DIAGNOSIS:  LEFT URETERAL STONE  POST-OPERATIVE DIAGNOSIS:  LEFT URETERAL STONE  PROCEDURE:  Procedure(s): CYSTOSCOPY WITH RETROGRADE PYELOGRAM, URETEROSCOPY , dilation cayal seal (Left) HOLMIUM LASER APPLICATION (Left) CYSTOSCOPY WITH STENT PLACEMENT (Left)  SURGEON:  Surgeon(s) and Role:    * Sebastian Ache, MD - Primary  PHYSICIAN ASSISTANT:   ASSISTANTS: none   ANESTHESIA:   general  EBL:  Total I/O In: 200 [I.V.:200] Out: -   BLOOD ADMINISTERED:none  DRAINS: none   LOCAL MEDICATIONS USED:  NONE  SPECIMEN:  Source of Specimen:  1 - Left Ureteral and Renal Stones  DISPOSITION OF SPECIMEN:  [facility] for Compositional Analysis  COUNTS:  YES  TOURNIQUET:  * No tourniquets in log *  DICTATION: .Other Dictation: Dictation Number [id number]  PLAN OF CARE: Discharge to home after PACU  PATIENT DISPOSITION:  PACU - hemodynamically stable.   Delay start of Pharmacological VTE agent (>24hrs) due to surgical blood loss or risk of bleeding: not applicable

## 2013-08-14 NOTE — Anesthesia Postprocedure Evaluation (Signed)
  Anesthesia Post-op Note  Patient: Robin Abbott  Procedure(s) Performed: Procedure(s) (LRB): CYSTOSCOPY WITH RETROGRADE PYELOGRAM, URETEROSCOPY , dilation cayal seal (Left) HOLMIUM LASER APPLICATION (Left) CYSTOSCOPY WITH STENT PLACEMENT (Left)  Patient Location: PACU  Anesthesia Type: General  Level of Consciousness: awake and alert   Airway and Oxygen Therapy: Patient Spontanous Breathing  Post-op Pain: mild  Post-op Assessment: Post-op Vital signs reviewed, Patient's Cardiovascular Status Stable, Respiratory Function Stable, Patent Airway and No signs of Nausea or vomiting  Last Vitals:  Filed Vitals:   08/14/13 1445  BP: 124/77  Pulse: 70  Temp:   Resp: 18    Post-op Vital Signs: stable   Complications: No apparent anesthesia complications

## 2013-08-14 NOTE — Op Note (Deleted)
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  PROCEDURE: 1. Cystoscopy with left retrograde pyelogram interpretation. 2. Left ureteroscopy with laser lithotripsy. 3. Dilation of left caliceal diverticulum. 4. Insertion of left ureteral stent 6x24 with tether to the left     thigh.  ESTIMATED BLOOD LOSS:  Nil.  COMPLICATIONS:  None.  SPECIMEN:  Left renal and ureteral stones for compositional analysis.  INDICATION:  This patient is a very pleasant 32 year old lady who was found on workup of colicky left flank pain to have multifocal left greater than right nephrolithiasis as well as left ureteral stone with hydronephrosis and right-sided stones were punctate by imaging, but the left side stones being much more impressive with the upper pole, lower pole, and ureteral stone.  Options were discussed including medical social therapy versus observation versus shockwave lithotripsy versus ureteroscopic stone manipulation.  She wished to proceed with the latter.  Given multifocality, informed consent was obtained and placed in medical record.  PROCEDURE IN DETAIL:  The patient being Robin Abbott was verified. Procedure being left ureteroscopic stone manipulation was confirmed. The procedure was carried out.  Time-out was performed.  Intravenous antibiotics were administered.  General LMA anesthesia was introduced. The patient was placed into a low lithotomy position.  Sterile field was created by prepping and draping the patient's vagina, introitus, and proximal thighs using iodine x3.  Next, cystourethroscopy was  performed using a 22-French rigid cystoscope with 12-degree offset lens.  Inspection of urinary bladder revealed no diverticula, calcifications, papular lesions.  The left ureteral orifice was cannulated with a 6-French end-hole catheter and left retrograde pyelogram was obtained.  Left retrograde pyelogram demonstrated a single left ureter with number status single left ureter with single stone in the left kidney.  There was an out patching of an upper pole calyx with a filling defect within it consistent with possible stone and calyceal diverticulum.  There was also a filling defect in the proximal ureter consistent with known stone.  There was no significant hydroureteronephrosis 0.038 Glidewire was advanced at the level of the upper pole and set aside as a safety wire.  An 8-French feeding tube placed in the urinary bladder for pressure release.  Next, a semi-rigid ureteroscopy performed in the distal two-thirds of the left ureter alongside a separate Sensor working wire.  No calcifications or mucosal abnormalities were found.  Next, the semi-rigid ureteroscope was exchanged with a 12/14, 24 cm ureteral access sheath was placed over the Sensor working wire.  Using fluoroscopic guidance to the level of the mid ureter.  Next, a flexible ureteroscopy was performed using 8-French digital ureteroscope of the proximal ureter and systematic inspection of each calix.  Indeed at the area of the UPJ, a fusiform relatively large calcification was found and this  was repositioned into a midpole calyx. This appeared to be much too large for simple basketing as such, holmium laser energy was applied using a 200 nanometer fiber at settings of 0.5 joules and 5 Hz fragmenting the stone into three smaller pieces which were then removed into an escape-type basket and set aside.  Repeat/additional systematic inspection of each calix revealed what appeared to be the mouth of a very stenotic upper  pole diverticulum with possible calcification within the holmium laser energy was applied to the mouth.  Opening this to a caliber approximately 8- to 10-French.  I suspect that the calcification was seen in this location.  This was grasped easily with escape basket and removed in its entirety.  Repeat examination reveal suggesting widening and dilation of the mouth of the diverticulum and final retrograde pyelogram revealed no extravasation from this or any other location.  The sheath was removed under continuous ureteroscopic vision.  No mucosal abnormalities were found. Finally, a new 6x24 double-J stent was placed over the remaining safety wire.  Proximal and distal curl were noted.  Tether was left in place, fashioned to the left thigh.  Procedure was terminated.  The patient tolerated the procedure well.  There were no immediate periprocedural complications.  The patient was taken to postanesthesia care in stable condition.          ______________________________ Sebastian Ache, MD     TM/MEDQ  D:  08/14/2013  T:  08/14/2013  Job:  [id number]

## 2013-08-14 NOTE — Op Note (Signed)
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  PROCEDURE: 1. Cystoscopy with left retrograde pyelogram interpretation. 2. Left ureteroscopy with laser lithotripsy. 3. Dilation of left caliceal diverticulum. 4. Insertion of left ureteral stent 6x24 with tether to the left     thigh.  ESTIMATED BLOOD LOSS:  Nil.  COMPLICATIONS:  None.  SPECIMEN:  Left renal and ureteral stones for compositional analysis.  INDICATION:  This patient is a very pleasant 31-year-old lady who was found on workup of colicky left flank pain to have multifocal left greater than right nephrolithiasis as well as left ureteral stone with hydronephrosis and right-sided stones were punctate by imaging, but the left side stones being much more impressive with the upper pole, lower pole, and ureteral stone.  Options were discussed including medical social therapy versus observation versus shockwave lithotripsy versus ureteroscopic stone manipulation.  She wished to proceed with the latter.  Given multifocality, informed consent was obtained and placed in medical record.  PROCEDURE IN DETAIL:  The patient being Robin Abbott was verified. Procedure being left ureteroscopic stone manipulation was confirmed. The procedure was carried out.  Time-out was performed.  Intravenous antibiotics were administered.  General LMA anesthesia was introduced. The patient was placed into a low lithotomy position.  Sterile field was created by prepping and draping the patient's vagina, introitus, and proximal thighs using iodine x3.  Next, cystourethroscopy was  performed using a 22-French rigid cystoscope with 12-degree offset lens.  Inspection of urinary bladder revealed no diverticula, calcifications, papular lesions.  The left ureteral orifice was cannulated with a 6-French end-hole catheter and left retrograde pyelogram was obtained.  Left retrograde pyelogram demonstrated a single left ureter with number status single left ureter with single stone in the left kidney.  There was an out patching of an upper pole calyx with a filling defect within it consistent with possible stone and calyceal diverticulum.  There was also a filling defect in the proximal ureter consistent with known stone.  There was no significant hydroureteronephrosis 0.038 Glidewire was advanced at the level of the upper pole and set aside as a safety wire.  An 8-French feeding tube placed in the urinary bladder for pressure release.  Next, a semi-rigid ureteroscopy performed in the distal two-thirds of the left ureter alongside a separate Sensor working wire.  No calcifications or mucosal abnormalities were found.  Next, the semi-rigid ureteroscope was exchanged with a 12/14, 24 cm ureteral access sheath was placed over the Sensor working wire.  Using fluoroscopic guidance to the level of the mid ureter.  Next, a flexible ureteroscopy was performed using 8-French digital ureteroscope of the proximal ureter and systematic inspection of each calix.  Indeed at the area of the UPJ, a fusiform relatively large calcification was found and this   was repositioned into a midpole calyx. This appeared to be much too large for simple basketing as such, holmium laser energy was applied using a 200 nanometer fiber at settings of 0.5 joules and 5 Hz fragmenting the stone into three smaller pieces which were then removed into an escape-type basket and set aside.  Repeat/additional systematic inspection of each calix revealed what appeared to be the mouth of a very stenotic upper  pole diverticulum with possible calcification within the holmium laser energy was applied to the mouth.  Opening this to a caliber approximately 8- to 10-French.  I suspect that the calcification was seen in this location.  This was grasped easily with escape basket and removed in its entirety.  Repeat examination reveal suggesting widening and dilation of the mouth of the diverticulum and final retrograde pyelogram revealed no extravasation from this or any other location.  The sheath was removed under continuous ureteroscopic vision.  No mucosal abnormalities were found. Finally, a new 6x24 double-J stent was placed over the remaining safety wire.  Proximal and distal curl were noted.  Tether was left in place, fashioned to the left thigh.  Procedure was terminated.  The patient tolerated the procedure well.  There were no immediate periprocedural complications.  The patient was taken to postanesthesia care in stable condition.          ______________________________ Saeed Toren, MD     TM/MEDQ  D:  08/14/2013  T:  08/14/2013  Job:  [id number] 

## 2013-08-15 ENCOUNTER — Encounter (HOSPITAL_BASED_OUTPATIENT_CLINIC_OR_DEPARTMENT_OTHER): Payer: Self-pay | Admitting: Urology

## 2013-08-19 ENCOUNTER — Encounter (HOSPITAL_COMMUNITY): Payer: Self-pay | Admitting: Emergency Medicine

## 2013-08-19 ENCOUNTER — Emergency Department (HOSPITAL_COMMUNITY)
Admission: EM | Admit: 2013-08-19 | Discharge: 2013-08-19 | Disposition: A | Discharge status: Home / Self Care | Payer: Medicaid Other | Attending: Emergency Medicine | Admitting: Emergency Medicine

## 2013-08-19 ENCOUNTER — Emergency Department (HOSPITAL_COMMUNITY): Payer: Medicaid Other

## 2013-08-19 DIAGNOSIS — R300 Dysuria: Secondary | ICD-10-CM | POA: Insufficient documentation

## 2013-08-19 DIAGNOSIS — Z9851 Tubal ligation status: Secondary | ICD-10-CM | POA: Insufficient documentation

## 2013-08-19 DIAGNOSIS — R110 Nausea: Secondary | ICD-10-CM | POA: Insufficient documentation

## 2013-08-19 DIAGNOSIS — F17200 Nicotine dependence, unspecified, uncomplicated: Secondary | ICD-10-CM | POA: Insufficient documentation

## 2013-08-19 DIAGNOSIS — R109 Unspecified abdominal pain: Secondary | ICD-10-CM | POA: Insufficient documentation

## 2013-08-19 DIAGNOSIS — Z87442 Personal history of urinary calculi: Secondary | ICD-10-CM | POA: Insufficient documentation

## 2013-08-19 DIAGNOSIS — Z3202 Encounter for pregnancy test, result negative: Secondary | ICD-10-CM | POA: Insufficient documentation

## 2013-08-19 DIAGNOSIS — J45909 Unspecified asthma, uncomplicated: Secondary | ICD-10-CM | POA: Insufficient documentation

## 2013-08-19 DIAGNOSIS — Z8719 Personal history of other diseases of the digestive system: Secondary | ICD-10-CM | POA: Insufficient documentation

## 2013-08-19 DIAGNOSIS — G8918 Other acute postprocedural pain: Secondary | ICD-10-CM | POA: Insufficient documentation

## 2013-08-19 DIAGNOSIS — Z79899 Other long term (current) drug therapy: Secondary | ICD-10-CM | POA: Insufficient documentation

## 2013-08-19 DIAGNOSIS — Z8659 Personal history of other mental and behavioral disorders: Secondary | ICD-10-CM | POA: Insufficient documentation

## 2013-08-19 LAB — URINALYSIS, ROUTINE W REFLEX MICROSCOPIC
Bilirubin Urine: NEGATIVE
Ketones, ur: NEGATIVE mg/dL
Nitrite: NEGATIVE
Protein, ur: NEGATIVE mg/dL

## 2013-08-19 LAB — COMPREHENSIVE METABOLIC PANEL
AST: 14 U/L (ref 0–37)
Albumin: 4.2 g/dL (ref 3.5–5.2)
BUN: 10 mg/dL (ref 6–23)
Chloride: 101 mEq/L (ref 96–112)
Creatinine, Ser: 1.04 mg/dL (ref 0.50–1.10)
Total Bilirubin: 0.2 mg/dL — ABNORMAL LOW (ref 0.3–1.2)
Total Protein: 8.1 g/dL (ref 6.0–8.3)

## 2013-08-19 LAB — CBC WITH DIFFERENTIAL/PLATELET
Basophils Absolute: 0 10*3/uL (ref 0.0–0.1)
Basophils Relative: 0 % (ref 0–1)
Eosinophils Absolute: 0.3 10*3/uL (ref 0.0–0.7)
HCT: 41.5 % (ref 36.0–46.0)
MCH: 30.9 pg (ref 26.0–34.0)
MCHC: 34.5 g/dL (ref 30.0–36.0)
Monocytes Absolute: 0.8 10*3/uL (ref 0.1–1.0)
Monocytes Relative: 6 % (ref 3–12)
Neutro Abs: 9.6 10*3/uL — ABNORMAL HIGH (ref 1.7–7.7)
Neutrophils Relative %: 69 % (ref 43–77)
RDW: 13 % (ref 11.5–15.5)

## 2013-08-19 LAB — URINE MICROSCOPIC-ADD ON

## 2013-08-19 MED ORDER — ONDANSETRON HCL 4 MG/2ML IJ SOLN
4.0000 mg | Freq: Once | INTRAMUSCULAR | Status: AC
  Administered 2013-08-19: 4 mg via INTRAVENOUS
  Filled 2013-08-19: qty 2

## 2013-08-19 MED ORDER — PHENAZOPYRIDINE HCL 200 MG PO TABS: 200.0000 mg | ORAL_TABLET | Freq: Three times a day (TID) | ORAL | Status: DC

## 2013-08-19 MED ORDER — SODIUM CHLORIDE 0.9 % IV BOLUS (SEPSIS)
1000.0000 mL | INTRAVENOUS | Status: AC
  Administered 2013-08-19: 1000 mL via INTRAVENOUS

## 2013-08-19 MED ORDER — MORPHINE SULFATE 4 MG/ML IJ SOLN
6.0000 mg | Freq: Once | INTRAMUSCULAR | Status: AC
  Administered 2013-08-19: 6 mg via INTRAVENOUS
  Filled 2013-08-19: qty 2

## 2013-08-19 NOTE — ED Notes (Signed)
Patient transported to X-ray 

## 2013-08-19 NOTE — ED Provider Notes (Signed)
CSN: [id number]     Arrival date & time 08/19/13  1630 History   First MD Initiated Contact with Patient 08/19/13 1755     Chief Complaint  Patient presents with  . Post-op Problem  . Flank Pain   (Consider location/radiation/quality/duration/timing/severity/associated sxs/prior Treatment) HPI Pt is a 32yo female with hx of nephrolithiasis seen on Wednesday, 9/10 for cystocopy by Dr. Berneice Heinrich, urology, for stent placement.  Was advised to remove stent this morning, so she removed around 8am this morning.  Since then, has had gradually increasing left flank pain, 7/10, has nor resolved with hydrocodone or tramadol.  States there is mild burning with urination from where the stent was removed.  Also c/o mild nausea w/o vomiting or diarrhea. Denies hematuria.  Called Dr. Emmaline Life office earlier today who advised pt to come to ED for further evaluation.   Past Medical History  Diagnosis Date  . Crohn's disease   . Asthma   . Anxiety   . History of kidney stones     DURING PRENGANCY  . Left ureteral calculus   . GERD (gastroesophageal reflux disease)   . Urgency of urination   . Complication of anesthesia     HARD TO WAKE POST T & A , AGE 70   Past Surgical History  Procedure Laterality Date  . Tubal ligation  11/05/2012    Procedure: POST PARTUM TUBAL LIGATION;  Surgeon: Adam Phenix, MD;  Location: [facility];  Service: Gynecology;  Laterality: Bilateral;  with clips  . Knee arthroscopy  11-18-1999    AND OPEN EXCISION OF JUMPERS KNEE W/ DRILLING INFERIOR POLE OF PATELLA  . Orif left ankle fx  02-26-2008  . Tonsillectomy and adenoidectomy  AGE 70  . Cystoscopy with retrograde pyelogram, ureteroscopy and stent placement Left 08/14/2013    Procedure: CYSTOSCOPY WITH RETROGRADE PYELOGRAM, URETEROSCOPY , dilation cayal seal;  Surgeon: Sebastian Ache, MD;  Location: [facility];  Service: Urology;  Laterality: Left;  . Holmium laser application Left 08/14/2013    Procedure: HOLMIUM  LASER APPLICATION;  Surgeon: Sebastian Ache, MD;  Location: [facility]  Service: Urology;  Laterality: Left;  . Cystoscopy with stent placement Left 08/14/2013    Procedure: CYSTOSCOPY WITH STENT PLACEMENT;  Surgeon: Sebastian Ache, MD;  Location: [facility];  Service: Urology;  Laterality: Left;   Family History  Problem Relation Age of Onset  . Diabetes Mother   . Hypertension Mother   . COPD Mother   . Depression Mother   . Anesthesia problems Mother     difficulty waking up and nausea and vomitting  . Hypertension Father   . Sleep apnea Father   . Diabetes Sister   . Heart disease Paternal Uncle   . Diabetes Maternal Grandmother   . Diabetes Maternal Grandfather   . Cancer Paternal Grandmother     cervical   History  Substance Use Topics  . Smoking status: Current Every Day Smoker -- 0.20 packs/day for 10 years    Types: Cigarettes  . Smokeless tobacco: Never Used     Comment: SMOKES 3 CIG. PER DAY  . Alcohol Use: No   OB History   Grav Para Term Preterm Abortions TAB SAB Ect Mult Living   3 2 2  0 1 0 1 0 0 2     Review of Systems  Constitutional: Negative for fever, chills and diaphoresis.  Gastrointestinal: Positive for nausea and abdominal pain ( LLQ). Negative for vomiting and diarrhea.  Genitourinary: Positive for dysuria and flank pain ( left). Negative for urgency, hematuria, decreased urine volume, vaginal bleeding, vaginal discharge, vaginal pain and pelvic pain.  All other systems reviewed and are negative.    Allergies  Hydrocodone; Keflex; and Tetracyclines & related  Home Medications   Current Outpatient Rx  Name  Route  Sig  Dispense  Refill  . acetaminophen (TYLENOL) 500 MG tablet   Oral   Take 500 mg by mouth every 6 (six) hours as needed for pain.         . Aspirin-Salicylamide-Caffeine (BC HEADACHE POWDER PO)   Oral   Take 1 packet by mouth daily as needed (for headache).         Marland Kitchen  HYDROcodone-acetaminophen (NORCO/VICODIN) 5-325 MG per tablet   Oral   Take 1 tablet by mouth every 6 (six) hours as needed for pain.         Marland Kitchen ibuprofen (ADVIL,MOTRIN) 200 MG tablet   Oral   Take 800 mg by mouth every 6 (six) hours as needed for pain.         Marland Kitchen ondansetron (ZOFRAN ODT) 8 MG disintegrating tablet   Oral   Take 1 tablet (8 mg total) by mouth every 8 (eight) hours as needed for nausea.   20 tablet   0   . senna-docusate (SENOKOT-S) 8.6-50 MG per tablet   Oral   Take 1 tablet by mouth 2 (two) times daily. While taking pain meds to prevent constipation.   30 tablet   1   . sulfamethoxazole-trimethoprim (BACTRIM DS) 800-160 MG per tablet   Oral   Take 1 tablet by mouth 2 (two) times daily. X 7 days.   14 tablet   0   . tamsulosin (FLOMAX) 0.4 MG CAPS capsule   Oral   Take by mouth daily.         . traMADol (ULTRAM) 50 MG tablet   Oral   Take 50-100 mg by mouth every 6 (six) hours as needed for pain.         Marland Kitchen albuterol (PROVENTIL HFA;VENTOLIN HFA) 108 (90 BASE) MCG/ACT inhaler   Inhalation   Inhale 2 puffs into the lungs every 6 (six) hours as needed. Rescue inhaler         . phenazopyridine (PYRIDIUM) 200 MG tablet   Oral   Take 1 tablet (200 mg total) by mouth 3 (three) times daily.   6 tablet   0    BP 140/67  Pulse 65  Temp(Src) 98.3 F (36.8 C) (Oral)  SpO2 100%  LMP 08/02/2013 Physical Exam  Nursing note and vitals reviewed. Constitutional: She appears well-developed and well-nourished. No distress.  HENT:  Head: Normocephalic and atraumatic.  Eyes: Conjunctivae are normal. No scleral icterus.  Neck: Normal range of motion.  Cardiovascular: Normal rate, regular rhythm and normal heart sounds.   Pulmonary/Chest: Effort normal and breath sounds normal. No respiratory distress. She has no wheezes. She has no rales. She exhibits no tenderness.  Abdominal: Soft. Bowel sounds are normal. She exhibits no distension and no mass. There is  tenderness ( left flank, LLQ). There is no rebound and no guarding.    Musculoskeletal: Normal range of motion.  Neurological: She is alert.  Skin: Skin is warm and dry. She is not diaphoretic.    ED Course  Procedures (including critical care time) Labs Review Labs Reviewed  URINALYSIS, ROUTINE W REFLEX MICROSCOPIC - Abnormal; Notable for the following:    Hgb urine dipstick  MODERATE (*)    Leukocytes, UA SMALL (*)    All other components within normal limits  CBC WITH DIFFERENTIAL - Abnormal; Notable for the following:    WBC 13.8 (*)    Neutro Abs 9.6 (*)    All other components within normal limits  COMPREHENSIVE METABOLIC PANEL - Abnormal; Notable for the following:    Total Bilirubin 0.2 (*)    GFR calc non Af Amer 71 (*)    GFR calc Af Amer 83 (*)    All other components within normal limits  PREGNANCY, URINE  URINE MICROSCOPIC-ADD ON   Imaging Review Dg Abd 1 View  08/19/2013   CLINICAL DATA:  Left-sided flank pain.  Recent cystoscopy.  EXAM: ABDOMEN - 1 VIEW  COMPARISON:  No priors.  FINDINGS: Gas and stool are seen scattered throughout the colon extending to the level of the distal rectum. No pathologic distension of small bowel is noted. No gross evidence of pneumoperitoneum. Tubal ligation clips noted in the pelvis bilaterally.  IMPRESSION: 1.  Nonobstructive bowel gas pattern. 2. No pneumoperitoneum. 3. Status post bilateral tubal ligation.   Electronically Signed   By: Trudie Reed M.D.   On: 08/19/2013 20:04    MDM   1. Left flank pain    Will tx pain and nausea: morphine, zofran, and fluids. KUB: to look for any remaining stones or radiopaque foreign bodies.   Labs: WBC 13.8, pt is still taking bactrim as prescribed by Dr. Berneice Heinrich, UA: moderate Hgb, otherwise unremarkable.  KUB: unremarkable.  After fluids, morphine and Zofran, pt states she is feeling better, pain is now described as "discomfort" and states she feels comfortable going home.  Will  discharge pt home and have her call Dr. Emmaline Life office tomorrow to see when she needs to f/u with him next. Rx: Pyridium.  Advised to continue taking bactrim until it is completed. Return precautions given. Pt verbalized understanding and agreement with tx plan. Vitals: unremarkable. Discharged in stable condition.    Discussed pt with attending during ED encounter.       Junius Finner, PA-C 08/19/13 2057

## 2013-08-19 NOTE — ED Notes (Signed)
Pt states had stent removed today for kidney stone, since has had painful urination, states the L flank pain is the same it's the discomfort w/ urination that has changed.

## 2013-08-19 NOTE — ED Provider Notes (Signed)
Medical screening examination/treatment/procedure(s) were performed by non-physician practitioner and as supervising physician I was immediately available for consultation/collaboration.   Richardean Canal, MD 08/19/13 2325

## 2013-08-19 NOTE — ED Notes (Signed)
Pt states that she had a Cystoscopy with stent placement 5 days ago for kidney stones and ever since she took out the stent this morning at 8am she has had L sided flank pain, painful urination, and nausea. Alert and oriented. Dr. Berneice Heinrich is urologist.

## 2014-01-21 IMAGING — CR DG CHEST 2V
2 series · 2 of 2 positions shown · non-contrast
Comparison: 01/30/2010.

CLINICAL DATA: Shortness of breath and chest pain.  Pregnancy.

CHEST - 2 VIEW

[w chest pa]
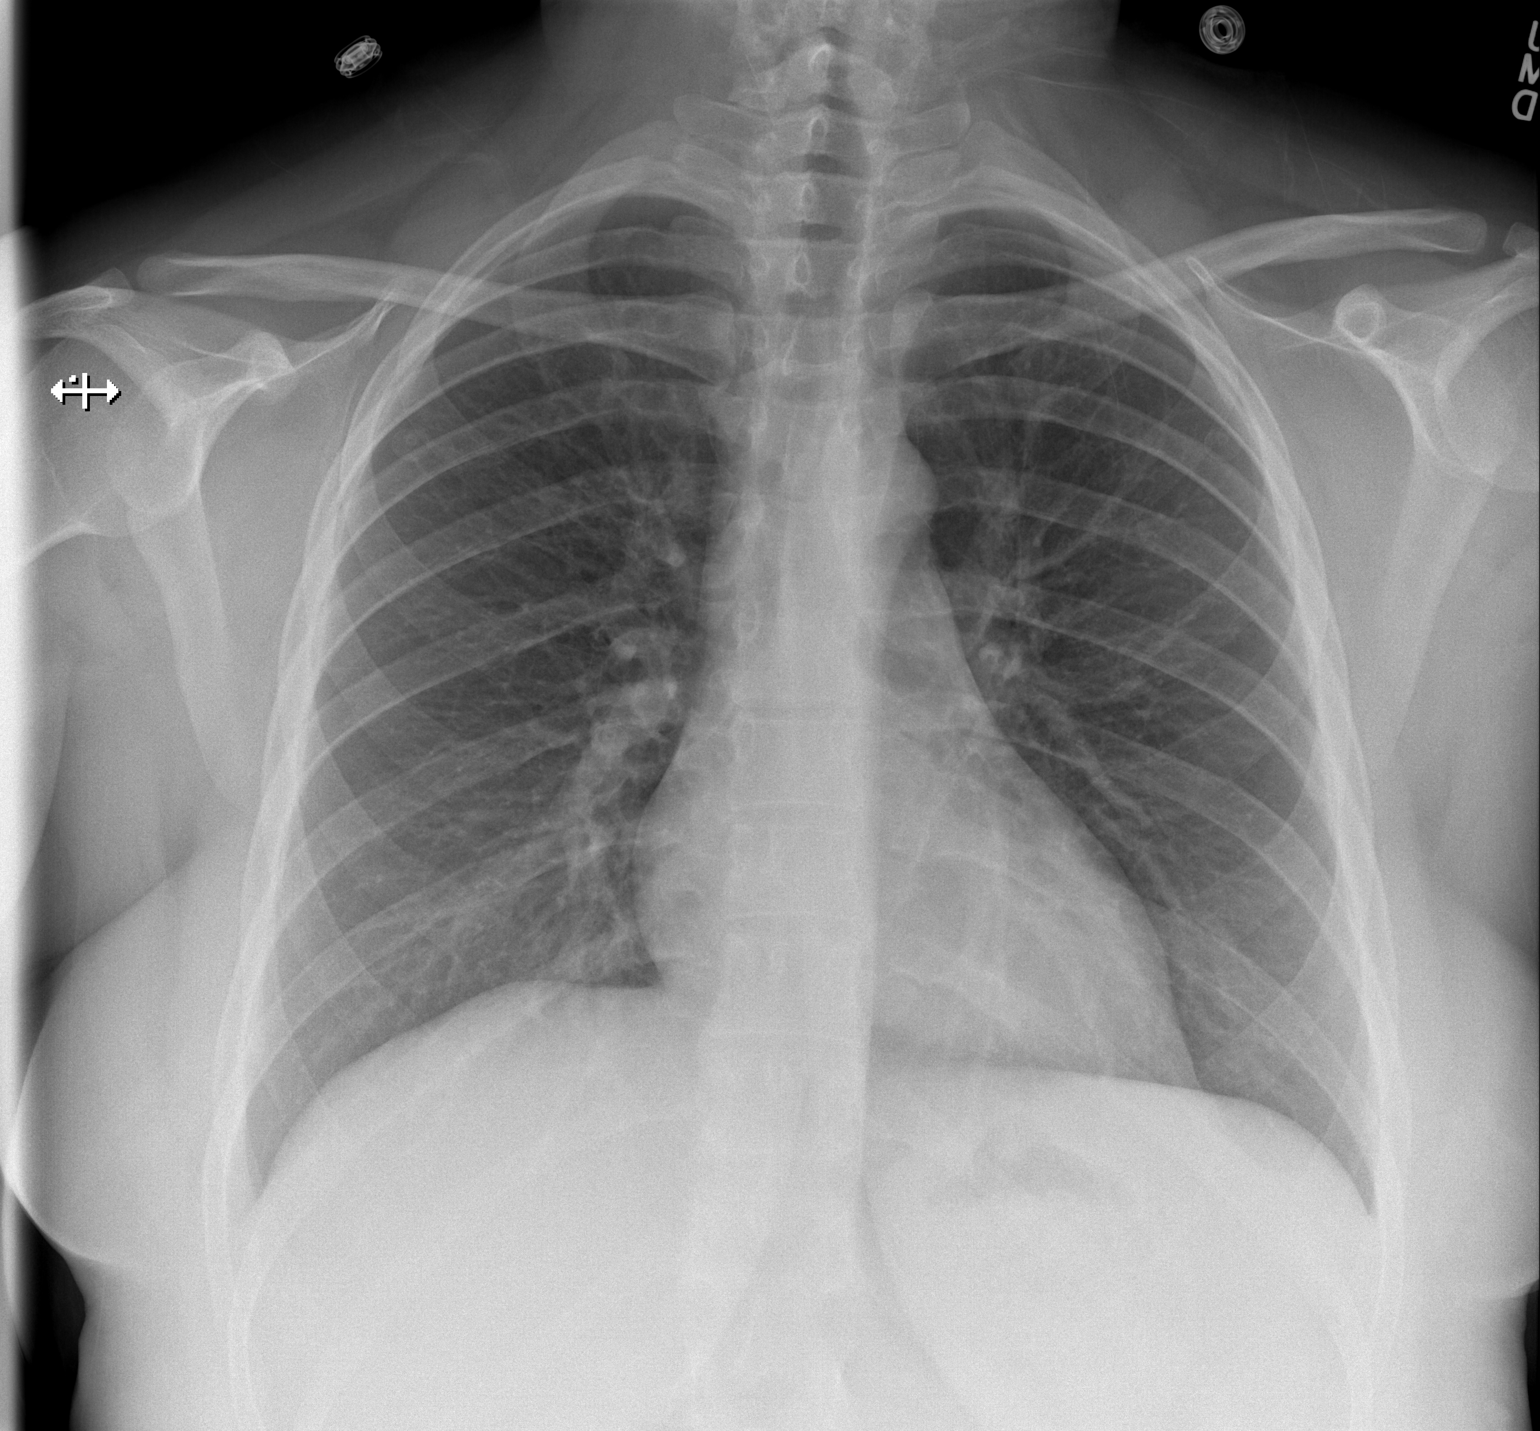

[w chest lat]
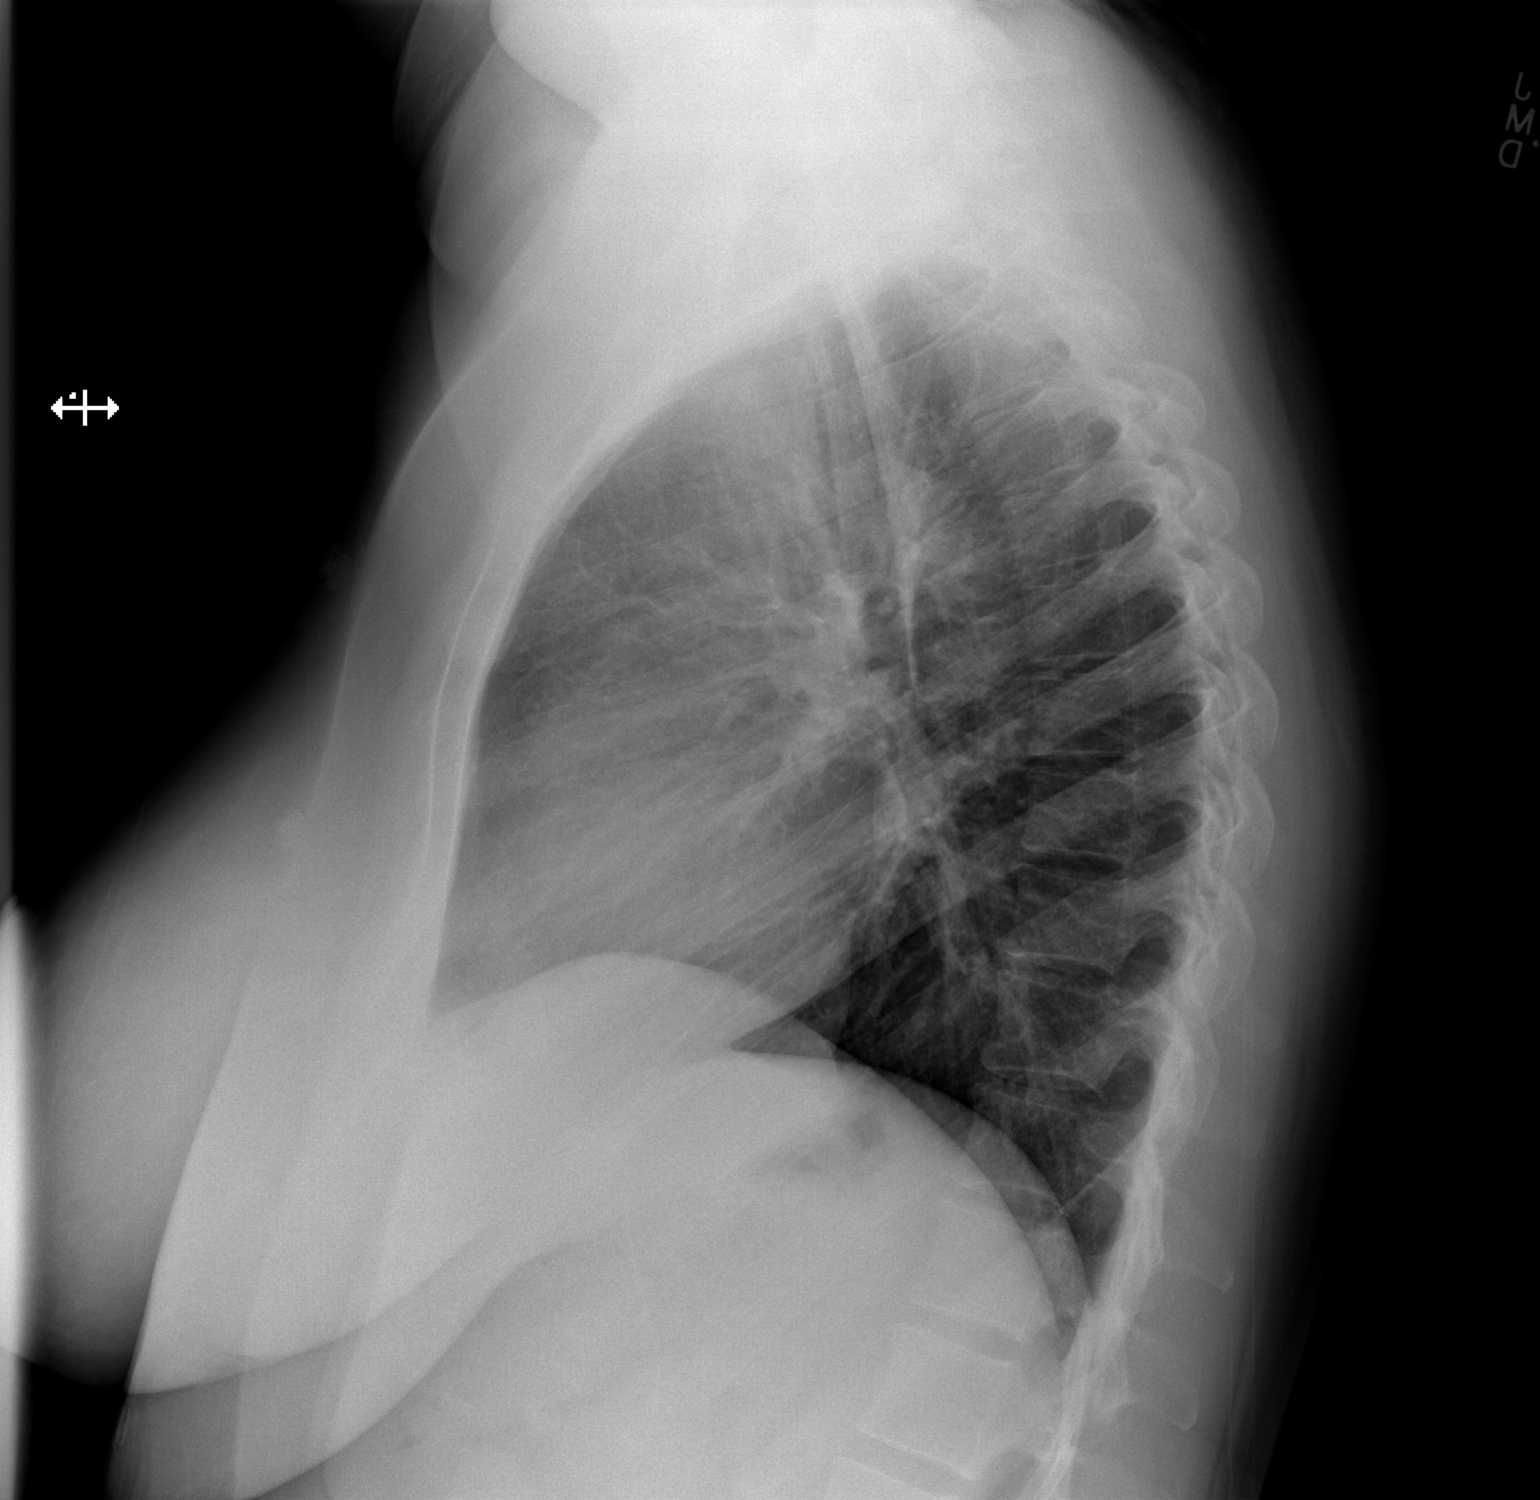

[2 of 2 positions shown; findings below may reference images not displayed]

FINDINGS: The patient was double shielded for the exam.

The heart size and mediastinal contours are within normal limits.
Both lungs are clear.  The visualized skeletal structures are
unremarkable. No change from priors.
IMPRESSION: No active cardiopulmonary disease.

## 2014-06-22 ENCOUNTER — Emergency Department (HOSPITAL_COMMUNITY)
Admission: EM | Admit: 2014-06-22 | Discharge: 2014-06-22 | Disposition: A | Discharge status: Home / Self Care | Payer: Medicaid Other | Attending: Emergency Medicine | Admitting: Emergency Medicine

## 2014-06-22 ENCOUNTER — Encounter (HOSPITAL_COMMUNITY): Payer: Self-pay | Admitting: Emergency Medicine

## 2014-06-22 DIAGNOSIS — Z87891 Personal history of nicotine dependence: Secondary | ICD-10-CM | POA: Diagnosis not present

## 2014-06-22 DIAGNOSIS — Z792 Long term (current) use of antibiotics: Secondary | ICD-10-CM | POA: Insufficient documentation

## 2014-06-22 DIAGNOSIS — Z3202 Encounter for pregnancy test, result negative: Secondary | ICD-10-CM | POA: Diagnosis not present

## 2014-06-22 DIAGNOSIS — J45909 Unspecified asthma, uncomplicated: Secondary | ICD-10-CM | POA: Diagnosis not present

## 2014-06-22 DIAGNOSIS — N201 Calculus of ureter: Secondary | ICD-10-CM | POA: Diagnosis not present

## 2014-06-22 DIAGNOSIS — Z8719 Personal history of other diseases of the digestive system: Secondary | ICD-10-CM | POA: Diagnosis not present

## 2014-06-22 DIAGNOSIS — Z79899 Other long term (current) drug therapy: Secondary | ICD-10-CM | POA: Diagnosis not present

## 2014-06-22 DIAGNOSIS — M545 Low back pain, unspecified: Secondary | ICD-10-CM | POA: Diagnosis not present

## 2014-06-22 DIAGNOSIS — F411 Generalized anxiety disorder: Secondary | ICD-10-CM | POA: Insufficient documentation

## 2014-06-22 DIAGNOSIS — Z87442 Personal history of urinary calculi: Secondary | ICD-10-CM | POA: Insufficient documentation

## 2014-06-22 DIAGNOSIS — R300 Dysuria: Secondary | ICD-10-CM | POA: Diagnosis not present

## 2014-06-22 LAB — POC URINE PREG, ED: Preg Test, Ur: NEGATIVE

## 2014-06-22 LAB — URINALYSIS, ROUTINE W REFLEX MICROSCOPIC
BILIRUBIN URINE: NEGATIVE
GLUCOSE, UA: NEGATIVE mg/dL
HGB URINE DIPSTICK: NEGATIVE
KETONES UR: NEGATIVE mg/dL
Leukocytes, UA: NEGATIVE
NITRITE: NEGATIVE
PH: 6 (ref 5.0–8.0)
Protein, ur: NEGATIVE mg/dL
SPECIFIC GRAVITY, URINE: 1.013 (ref 1.005–1.030)
Urobilinogen, UA: 0.2 mg/dL (ref 0.0–1.0)

## 2014-06-22 MED ORDER — MELOXICAM 7.5 MG PO TABS: 15.0000 mg | ORAL_TABLET | Freq: Every day | ORAL | Status: DC

## 2014-06-22 MED ORDER — HYDROCODONE-ACETAMINOPHEN 5-325 MG PO TABS: 1.0000 | ORAL_TABLET | Freq: Four times a day (QID) | ORAL | Status: DC | PRN

## 2014-06-22 MED ORDER — METHOCARBAMOL 500 MG PO TABS
500.0000 mg | ORAL_TABLET | Freq: Two times a day (BID) | ORAL | Status: DC
Start: 2014-06-22 — End: 2016-05-28

## 2014-06-22 NOTE — Discharge Instructions (Signed)

## 2014-06-22 NOTE — ED Notes (Signed)
She states "im having lower back pain. It started after i had some burning when i peed but that burning went away. Ive had kidney stones before but this doesn't feel the same."

## 2014-06-22 NOTE — ED Provider Notes (Signed)
CSN: [id number]     Arrival date & time 06/22/14  1436 History   First MD Initiated Contact with Patient 06/22/14 1626     Chief Complaint  Patient presents with  . Back Pain     (Consider location/radiation/quality/duration/timing/severity/associated sxs/prior Treatment) HPI Pt is a 33yo female with hx of kidney stones during pregnancy presenting to ED with c/o left lower back pain x3-4 days. Pain is constant, aching and sharp, 6/10, worse with certain movements. Pt reports hx of dysuria 2 days prior to back pain. Denies fever, n/v/d. Denies known injury. States pain feels different than kidney stones. Denies numbness in groin or legs. Pain does not radiate. Denies hx of cancer or IVDU. Denies rashes or skin lesions.  Past Medical History  Diagnosis Date  . Crohn's disease   . Asthma   . Anxiety   . History of kidney stones     DURING PRENGANCY  . Left ureteral calculus   . GERD (gastroesophageal reflux disease)   . Urgency of urination   . Complication of anesthesia     HARD TO WAKE POST T & A , AGE 12   Past Surgical History  Procedure Laterality Date  . Tubal ligation  11/05/2012    Procedure: POST PARTUM TUBAL LIGATION;  Surgeon: Adam PhenixJames G Arnold, MD;  Location: [facility];  Service: Gynecology;  Laterality: Bilateral;  with clips  . Knee arthroscopy  11-18-1999    AND OPEN EXCISION OF JUMPERS KNEE W/ DRILLING INFERIOR POLE OF PATELLA  . Orif left ankle fx  02-26-2008  . Tonsillectomy and adenoidectomy  AGE 12  . Cystoscopy with retrograde pyelogram, ureteroscopy and stent placement Left 08/14/2013    Procedure: CYSTOSCOPY WITH RETROGRADE PYELOGRAM, URETEROSCOPY , dilation cayal seal;  Surgeon: Sebastian Acheheodore Manny, MD;  [facility];  Service: Urology;  Laterality: Left;  . Holmium laser application Left 08/14/2013    Procedure: HOLMIUM LASER APPLICATION;  Surgeon: Sebastian Acheheodore Manny, MD;  [facility];  Service: Urology;  Laterality: Left;  .  Cystoscopy with stent placement Left 08/14/2013    Procedure: CYSTOSCOPY WITH STENT PLACEMENT;  Surgeon: Sebastian Acheheodore Manny, MD;  [facility] [place];  Service: Urology;  Laterality: Left;   Family History  Problem Relation Age of Onset  . Diabetes Mother   . Hypertension Mother   . COPD Mother   . Depression Mother   . Anesthesia problems Mother     difficulty waking up and nausea and vomitting  . Hypertension Father   . Sleep apnea Father   . Diabetes Sister   . Heart disease Paternal Uncle   . Diabetes Maternal Grandmother   . Diabetes Maternal Grandfather   . Cancer Paternal Grandmother     cervical   History  Substance Use Topics  . Smoking status: Former Smoker -- 0.20 packs/day for 10 years  . Smokeless tobacco: Never Used  . Alcohol Use: No   OB History   Grav Para Term Preterm Abortions TAB SAB Ect Mult Living   3 2 2  0 1 0 1 0 0 2     Review of Systems  Constitutional: Negative for fever and chills.  Respiratory: Negative for cough and shortness of breath.   Cardiovascular: Negative for chest pain, palpitations and leg swelling.  Gastrointestinal: Negative for nausea, vomiting, abdominal pain and diarrhea.  Genitourinary: Positive for dysuria. Negative for urgency, frequency, hematuria, flank pain, decreased urine volume, vaginal bleeding, vaginal discharge, vaginal pain, menstrual problem and pelvic  pain.  Musculoskeletal: Positive for back pain and myalgias. Negative for neck pain and neck stiffness.  Skin: Negative for color change, rash and wound.  All other systems reviewed and are negative.     Allergies  Hydrocodone; Keflex; and Tetracyclines & related  Home Medications   Prior to Admission medications   Medication Sig Start Date End Date Taking? Authorizing Provider  acetaminophen (TYLENOL) 500 MG tablet Take 500 mg by mouth every 6 (six) hours as needed for pain.    Historical Provider, MD  albuterol (PROVENTIL HFA;VENTOLIN HFA) 108  (90 BASE) MCG/ACT inhaler Inhale 2 puffs into the lungs every 6 (six) hours as needed. Rescue inhaler    Historical Provider, MD  Aspirin-Salicylamide-Caffeine (BC HEADACHE POWDER PO) Take 1 packet by mouth daily as needed (for headache).    Historical Provider, MD  HYDROcodone-acetaminophen (NORCO/VICODIN) 5-325 MG per tablet Take 1 tablet by mouth every 6 (six) hours as needed for pain.    Historical Provider, MD  HYDROcodone-acetaminophen (NORCO/VICODIN) 5-325 MG per tablet Take 1-2 tablets by mouth every 6 (six) hours as needed for moderate pain or severe pain. 06/22/14   Junius Finner, PA-C  ibuprofen (ADVIL,MOTRIN) 200 MG tablet Take 800 mg by mouth every 6 (six) hours as needed for pain.    Historical Provider, MD  meloxicam (MOBIC) 7.5 MG tablet Take 2 tablets (15 mg total) by mouth daily. 06/22/14   Junius Finner, PA-C  methocarbamol (ROBAXIN) 500 MG tablet Take 1 tablet (500 mg total) by mouth 2 (two) times daily. 06/22/14   Junius Finner, PA-C  ondansetron (ZOFRAN ODT) 8 MG disintegrating tablet Take 1 tablet (8 mg total) by mouth every 8 (eight) hours as needed for nausea. 02/26/13   Hilario Quarry, MD  phenazopyridine (PYRIDIUM) 200 MG tablet Take 1 tablet (200 mg total) by mouth 3 (three) times daily. 08/19/13   Junius Finner, PA-C  senna-docusate (SENOKOT-S) 8.6-50 MG per tablet Take 1 tablet by mouth 2 (two) times daily. While taking pain meds to prevent constipation. 08/14/13   Sebastian Ache, MD  sulfamethoxazole-trimethoprim (BACTRIM DS) 800-160 MG per tablet Take 1 tablet by mouth 2 (two) times daily. X 7 days. 08/14/13   Sebastian Ache, MD  tamsulosin (FLOMAX) 0.4 MG CAPS capsule Take by mouth daily.    Historical Provider, MD  traMADol (ULTRAM) 50 MG tablet Take 50-100 mg by mouth every 6 (six) hours as needed for pain.    Historical Provider, MD   BP 138/92  Pulse 67  Temp(Src) 98.2 F (36.8 C) (Oral)  Resp 16  Ht 5\' 4"  (1.626 m)  Wt 207 lb 12.8 oz (94.257 kg)  BMI 35.65 kg/m2   SpO2 99% Physical Exam  Nursing note and vitals reviewed. Constitutional: She appears well-developed and well-nourished. No distress.  HENT:  Head: Normocephalic and atraumatic.  Eyes: Conjunctivae are normal. No scleral icterus.  Neck: Normal range of motion. Neck supple.  No midline bone tenderness, no crepitus or step-offs.    Cardiovascular: Normal rate, regular rhythm and normal heart sounds.   Pulmonary/Chest: Effort normal and breath sounds normal. No respiratory distress. She has no wheezes. She has no rales. She exhibits no tenderness.  Abdominal: Soft. Bowel sounds are normal. She exhibits no distension and no mass. There is no tenderness. There is no rebound and no guarding.  Musculoskeletal: Normal range of motion. She exhibits tenderness. She exhibits no edema.  Tenderness along left lower lumbar musculature. No midline spinale tenderness. Increased pain with left straight leg raise.  5/5 strength in upper and lower extremities. FROM all extremities w/o ataxia,   Neurological: She is alert.  Antalgic gait  Skin: Skin is warm and dry. No rash noted. She is not diaphoretic. No erythema.    ED Course  Procedures (including critical care time) Labs Review Labs Reviewed  URINALYSIS, ROUTINE W REFLEX MICROSCOPIC  POC URINE PREG, ED    Imaging Review No results found.   EKG Interpretation None      MDM   Final diagnoses:  Left-sided low back pain without sciatica    Pt is a 33yo female c/o left lower back pain, worse with palpation. UA: unremarkable. Urine preg: negative. Pain appears muscular in nature.  Do not believe imaging needed at this time. Not concerned for emergent process taking place. Will tx symptomatically as needed for pain. Not concerned for spinal abscess, cauda equina, pyelonephritis, renal stone or other emergent process taking place at this time. Rx: robaxin, mobic, and norco.  Return precautions provided. Pt verbalized understanding and agreement  with tx plan.   Junius Finner, PA-C 06/22/14 1721

## 2014-06-24 NOTE — ED Provider Notes (Signed)
Medical screening examination/treatment/procedure(s) were performed by non-physician practitioner and as supervising physician I was immediately available for consultation/collaboration.   EKG Interpretation None        Gerre Ranum T Tammee Thielke, MD 06/24/14 1502 

## 2014-10-06 ENCOUNTER — Encounter (HOSPITAL_COMMUNITY): Payer: Self-pay | Admitting: Emergency Medicine

## 2014-12-25 ENCOUNTER — Emergency Department (HOSPITAL_COMMUNITY)
Admission: EM | Admit: 2014-12-25 | Discharge: 2014-12-25 | Disposition: A | Discharge status: Home / Self Care | Payer: Medicaid Other | Attending: Emergency Medicine | Admitting: Emergency Medicine

## 2014-12-25 ENCOUNTER — Encounter (HOSPITAL_COMMUNITY): Payer: Self-pay | Admitting: Emergency Medicine

## 2014-12-25 DIAGNOSIS — Y9389 Activity, other specified: Secondary | ICD-10-CM | POA: Insufficient documentation

## 2014-12-25 DIAGNOSIS — Z8719 Personal history of other diseases of the digestive system: Secondary | ICD-10-CM | POA: Insufficient documentation

## 2014-12-25 DIAGNOSIS — Z720 Tobacco use: Secondary | ICD-10-CM | POA: Insufficient documentation

## 2014-12-25 DIAGNOSIS — Z79899 Other long term (current) drug therapy: Secondary | ICD-10-CM | POA: Insufficient documentation

## 2014-12-25 DIAGNOSIS — J45909 Unspecified asthma, uncomplicated: Secondary | ICD-10-CM | POA: Insufficient documentation

## 2014-12-25 DIAGNOSIS — Z792 Long term (current) use of antibiotics: Secondary | ICD-10-CM | POA: Insufficient documentation

## 2014-12-25 DIAGNOSIS — T1591XA Foreign body on external eye, part unspecified, right eye, initial encounter: Secondary | ICD-10-CM

## 2014-12-25 DIAGNOSIS — Y9289 Other specified places as the place of occurrence of the external cause: Secondary | ICD-10-CM | POA: Insufficient documentation

## 2014-12-25 DIAGNOSIS — Z87442 Personal history of urinary calculi: Secondary | ICD-10-CM | POA: Diagnosis not present

## 2014-12-25 DIAGNOSIS — S00251A Superficial foreign body of right eyelid and periocular area, initial encounter: Secondary | ICD-10-CM | POA: Insufficient documentation

## 2014-12-25 DIAGNOSIS — Z791 Long term (current) use of non-steroidal anti-inflammatories (NSAID): Secondary | ICD-10-CM | POA: Diagnosis not present

## 2014-12-25 DIAGNOSIS — Y998 Other external cause status: Secondary | ICD-10-CM | POA: Diagnosis not present

## 2014-12-25 DIAGNOSIS — X58XXXA Exposure to other specified factors, initial encounter: Secondary | ICD-10-CM | POA: Diagnosis not present

## 2014-12-25 DIAGNOSIS — Z8659 Personal history of other mental and behavioral disorders: Secondary | ICD-10-CM | POA: Diagnosis not present

## 2014-12-25 NOTE — ED Notes (Signed)
Pt states she had her contacts in earlier and now she cannot find the one in her right eye and her right eye is hurting

## 2014-12-25 NOTE — ED Provider Notes (Addendum)
CSN: [id number]     Arrival date & time 12/25/14  1931 History   First MD Initiated Contact with Patient 12/25/14 2015   This chart was scribed for non-physician practitioner, Elpidio AnisShari Arena Lindahl, working with Gwyneth SproutWhitney Plunkett, MD by Marica OtterNusrat Rahman, ED Scribe. This patient was seen in room WTR8/WTR8 and the patient's care was started at 8:17 PM.    Chief Complaint  Patient presents with  . Foreign Body in Eye   The history is provided by the patient. No language interpreter was used.    HPI Comments: Robin Abbott is a 34 y.o. female, with medical Hx noted below, who presents to the Emergency Department complaining of one of her contacts being stuck in her right eye and associated right eye pain and watering of the eye onset approximately one hour ago. Pt reports she uses daily use contacts and after putting in her contact today she rubbed her eyes which caused her contact to get pushed up in her eye. Pt denies any visual disturbances or purulent discharge.   Past Medical History  Diagnosis Date  . Crohn's disease   . Asthma   . Anxiety   . History of kidney stones     DURING PRENGANCY  . Left ureteral calculus   . GERD (gastroesophageal reflux disease)   . Urgency of urination   . Complication of anesthesia     HARD TO WAKE POST T & A , AGE 50   Past Surgical History  Procedure Laterality Date  . Tubal ligation  11/05/2012    Procedure: POST PARTUM TUBAL LIGATION;  Surgeon: Adam PhenixJames G Arnold, MD;  Location: [facility];  Service: Gynecology;  Laterality: Bilateral;  with clips  . Knee arthroscopy  11-18-1999    AND OPEN EXCISION OF JUMPERS KNEE W/ DRILLING INFERIOR POLE OF PATELLA  . Orif left ankle fx  02-26-2008  . Tonsillectomy and adenoidectomy  AGE 50  . Cystoscopy with retrograde pyelogram, ureteroscopy and stent placement Left 08/14/2013    Procedure: CYSTOSCOPY WITH RETROGRADE PYELOGRAM, URETEROSCOPY , dilation cayal seal;  Surgeon: Sebastian Acheheodore Manny, MD;  [facility];  Service: Urology;  Laterality: Left;  . Holmium laser application Left 08/14/2013    Procedure: HOLMIUM LASER APPLICATION;  Surgeon: Sebastian Acheheodore Manny, MD;  [facility];  Service: Urology;  Laterality: Left;  . Cystoscopy with stent placement Left 08/14/2013    Procedure: CYSTOSCOPY WITH STENT PLACEMENT;  Surgeon: Sebastian Acheheodore Manny, MD;  [facility];  Service: Urology;  Laterality: Left;   Family History  Problem Relation Age of Onset  . Diabetes Mother   . Hypertension Mother   . COPD Mother   . Depression Mother   . Anesthesia problems Mother     difficulty waking up and nausea and vomitting  . Hypertension Father   . Sleep apnea Father   . Diabetes Sister   . Heart disease Paternal Uncle   . Diabetes Maternal Grandmother   . Diabetes Maternal Grandfather   . Cancer Paternal Grandmother     cervical   History  Substance Use Topics  . Smoking status: Current Every Day Smoker -- 0.20 packs/day for 10 years    Types: Cigarettes  . Smokeless tobacco: Never Used  . Alcohol Use: No   OB History    Gravida Para Term Preterm AB TAB SAB Ectopic Multiple Living   3 2 2  0 1 0 1 0 0 2     Review of Systems  Constitutional: Negative for fever and chills.  Eyes: Positive for pain, discharge and redness. Negative for itching and visual disturbance.  Psychiatric/Behavioral: Negative for confusion.      Allergies  Hydrocodone; Keflex; and Tetracyclines & related  Home Medications   Prior to Admission medications   Medication Sig Start Date End Date Taking? Authorizing Provider  HYDROcodone-acetaminophen (NORCO/VICODIN) 5-325 MG per tablet Take 1-2 tablets by mouth every 6 (six) hours as needed for moderate pain or severe pain. 06/22/14   Junius Finner, PA-C  ibuprofen (ADVIL,MOTRIN) 200 MG tablet Take 800 mg by mouth every 6 (six) hours as needed for pain.    Historical Provider, MD  meloxicam (MOBIC) 7.5 MG tablet Take 2 tablets  (15 mg total) by mouth daily. 06/22/14   Junius Finner, PA-C  methocarbamol (ROBAXIN) 500 MG tablet Take 1 tablet (500 mg total) by mouth 2 (two) times daily. 06/22/14   Junius Finner, PA-C  sulfamethoxazole-trimethoprim (BACTRIM DS) 800-160 MG per tablet Take 1 tablet by mouth 2 (two) times daily. X 7 days. 08/14/13   Sebastian Ache, MD   Triage Vitals: BP 140/95 mmHg  Pulse 81  Temp(Src) 97.6 F (36.4 C) (Oral)  Resp 16  SpO2 100%  LMP  Physical Exam  Constitutional: She is oriented to person, place, and time. She appears well-developed and well-nourished. No distress.  HENT:  Head: Normocephalic and atraumatic.  Eyes: Conjunctivae and EOM are normal.  right eye injected conjunctiva, excessive tearing, no purulent discharge, no chemosis. Foreign body central upper lid, easily removed in whole with upper lid manipulation.   Cardiovascular: Normal rate.   Pulmonary/Chest: Effort normal. No respiratory distress.  Musculoskeletal: Normal range of motion.  Neurological: She is alert and oriented to person, place, and time.  Skin: Skin is warm and dry.  Psychiatric: She has a normal mood and affect. Her behavior is normal.  Nursing note and vitals reviewed.   ED Course  Procedures (including critical care time) DIAGNOSTIC STUDIES: Oxygen Saturation is 100% on RA, nl by my interpretation.    COORDINATION OF CARE: 8:20 PM-Discussed treatment plan with pt at bedside and pt agreed to plan.   Labs Review Labs Reviewed - No data to display  Imaging Review No results found.   EKG Interpretation None     Foreign body removal:  Foreign body central upper lid, easily removed in whole with upper lid manipulation.     MDM   Final diagnoses:  None    1. Foreign body right eye, successfully removed  Uncomplicated FB removal with pain resolution after removal.   I personally performed the services described in this documentation, which was scribed in my presence. The recorded  information has been reviewed and is accurate.     Arnoldo Hooker, PA-C 01/07/15 2025  Gwyneth Sprout, MD 01/10/15 0001  Arnoldo Hooker, PA-C 01/15/15 [id number]  Gwyneth Sprout, MD 01/16/15 [phone]

## 2014-12-25 NOTE — Discharge Instructions (Signed)
Eye, Foreign Body The term foreign body refers to any object near, on the surface of or in the eye that should not be there. A foreign body may be a small speck of dirt or dust, a hair or eyelash, a splinter or any object. CAUSES  Foreign bodies can get in the eye by:  Flying pieces of something that was broken or destroyed (debris).  A sudden injury (trauma) to the eye. SYMPTOMS  Symptoms depend on what the foreign body is and where it is in the eye. The most common locations are:  On the inner surface of the upper or lower eyelids or on the covering of the white part of the eye (conjunctiva). Symptoms in this location are:  Irritating and painful, especially when blinking.  Feeling like something is in the eye.  On the surface of the clear covering on the front of the eye (cornea). A corneal foreign body has symptoms that:  Are painful and irritating since the cornea is very sensitive.  Form small "rust rings" around a metallic foreign body. Metallic foreign bodies stick more firmly to the surface of the cornea.  Inside the eyeball. Infection can happen fast and can be hard to treat with antibiotics. This is an extremely dangerous situation. Foreign bodies inside the eye can threaten vision. A person may even loose their eye. Foreign bodies inside the eye may cause:  Great pain.  Immediate loss of vision. DIAGNOSIS  Foreign bodies are found during an exam by an eye specialist. Those that are on the eyelids, conjunctiva or cornea are usually (but not always) easily found. When a foreign body is inside the eyeball, a cataract may form almost right away. This makes it hard for an ophthalmologist to find the foreign body. Special tests may be needed, including ultrasound testing, X-rays and CT scans. TREATMENT   Foreign bodies that are on the eyelids, conjunctiva or cornea are often removed easily and painlessly.  If the foreign body has caused a scratch or abrasion of the cornea,  antibiotic drops, ointments and/or a tight patch called a "pressure patch" may be needed. Follow-up exams will be needed for several days until the abrasion heals.  Surgery is needed right away if the foreign body is inside the eyeball. This is a medical emergency. An antibiotic therapy will likely be given to stop an infection. HOME CARE INSTRUCTIONS  The use of eye patches is not universal. Their use varies from state to state and from caregiver to caregiver. If an eye patch was applied:  Keep the eye patch on for as long as directed by your caregiver until the follow-up appointment.  Do not remove the patch to put in medications unless instructed to do so. When replacing the patch, retape it as it was before. Follow the same procedure if the patch becomes loose.  WARNING: Do not drive or operate machinery while the eye is patched. The ability to judge distances will be impaired.  Only take over-the-counter or prescription medicines for pain, discomfort or fever as directed by the caregiver. If no eye patch was applied:  Keep the eye closed as much as possible. Do not rub the eye.  Wear dark glasses as needed to protect the eyes from bright light.  Do not wear contact lenses until the eye feels normal again, or as instructed.  Wear protective eye covering if there is a risk of eye injury. This is important when working with high speed tools.  Only take over-the-counter or   prescription medicines for pain, discomfort or fever as directed by the caregiver. SEEK IMMEDIATE MEDICAL CARE IF:   Pain increases in the eye or the vision changes.  You or your child has problems with the eye patch.  The injury to the eye appears to be getting larger.  There is discharge from the injured eye.  Swelling and/or soreness (inflammation) develops around the affected eye.  You or your child has an oral temperature above 102 F (38.9 C), not controlled by medicine.  Your baby is older than 3  months with a rectal temperature of 102 F (38.9 C) or higher.  Your baby is 3 months old or younger with a rectal temperature of 100.4 F (38 C) or higher. MAKE SURE YOU:   Understand these instructions.  Will watch your condition.  Will get help right away if you are not doing well or get worse. Document Released: 11/21/2005 Document Revised: 02/13/2012 Document Reviewed: 04/18/2013 [employer] Patient Information 2015 [employer]. This information is not intended to replace advice given to you by your health care provider. Make sure you discuss any questions you have with your health care provider.  

## 2015-03-09 ENCOUNTER — Emergency Department (HOSPITAL_COMMUNITY)
Admission: EM | Admit: 2015-03-09 | Discharge: 2015-03-09 | Disposition: A | Discharge status: Home / Self Care | Payer: Medicaid Other | Attending: Emergency Medicine | Admitting: Emergency Medicine

## 2015-03-09 ENCOUNTER — Encounter (HOSPITAL_COMMUNITY): Payer: Self-pay | Admitting: Emergency Medicine

## 2015-03-09 DIAGNOSIS — Z8659 Personal history of other mental and behavioral disorders: Secondary | ICD-10-CM | POA: Insufficient documentation

## 2015-03-09 DIAGNOSIS — T7840XA Allergy, unspecified, initial encounter: Secondary | ICD-10-CM | POA: Insufficient documentation

## 2015-03-09 DIAGNOSIS — Y9289 Other specified places as the place of occurrence of the external cause: Secondary | ICD-10-CM | POA: Insufficient documentation

## 2015-03-09 DIAGNOSIS — Y9389 Activity, other specified: Secondary | ICD-10-CM | POA: Insufficient documentation

## 2015-03-09 DIAGNOSIS — Z791 Long term (current) use of non-steroidal anti-inflammatories (NSAID): Secondary | ICD-10-CM | POA: Diagnosis not present

## 2015-03-09 DIAGNOSIS — X58XXXA Exposure to other specified factors, initial encounter: Secondary | ICD-10-CM | POA: Insufficient documentation

## 2015-03-09 DIAGNOSIS — Z720 Tobacco use: Secondary | ICD-10-CM | POA: Insufficient documentation

## 2015-03-09 DIAGNOSIS — J45909 Unspecified asthma, uncomplicated: Secondary | ICD-10-CM | POA: Diagnosis not present

## 2015-03-09 DIAGNOSIS — Z79899 Other long term (current) drug therapy: Secondary | ICD-10-CM | POA: Insufficient documentation

## 2015-03-09 DIAGNOSIS — Y998 Other external cause status: Secondary | ICD-10-CM | POA: Insufficient documentation

## 2015-03-09 DIAGNOSIS — Z8742 Personal history of other diseases of the female genital tract: Secondary | ICD-10-CM | POA: Diagnosis not present

## 2015-03-09 DIAGNOSIS — Z8719 Personal history of other diseases of the digestive system: Secondary | ICD-10-CM | POA: Insufficient documentation

## 2015-03-09 DIAGNOSIS — R21 Rash and other nonspecific skin eruption: Secondary | ICD-10-CM | POA: Diagnosis present

## 2015-03-09 MED ORDER — FAMOTIDINE 20 MG PO TABS
20.0000 mg | ORAL_TABLET | Freq: Once | ORAL | Status: AC
  Administered 2015-03-09: 20 mg via ORAL
  Filled 2015-03-09: qty 1

## 2015-03-09 MED ORDER — LORATADINE 10 MG PO TABS: 10.0000 mg | ORAL_TABLET | Freq: Every day | ORAL | Status: DC

## 2015-03-09 MED ORDER — PREDNISONE 20 MG PO TABS: 40.0000 mg | ORAL_TABLET | Freq: Every day | ORAL | Status: DC

## 2015-03-09 MED ORDER — PREDNISONE 20 MG PO TABS
60.0000 mg | ORAL_TABLET | Freq: Once | ORAL | Status: AC
  Administered 2015-03-09: 60 mg via ORAL
  Filled 2015-03-09: qty 3

## 2015-03-09 MED ORDER — LORATADINE 10 MG PO TABS
10.0000 mg | ORAL_TABLET | Freq: Once | ORAL | Status: AC
  Administered 2015-03-09: 10 mg via ORAL
  Filled 2015-03-09: qty 1

## 2015-03-09 MED ORDER — DIPHENHYDRAMINE HCL 25 MG PO TABS: 25.0000 mg | ORAL_TABLET | Freq: Four times a day (QID) | ORAL | Status: AC

## 2015-03-09 MED ORDER — FAMOTIDINE 20 MG PO TABS: 20.0000 mg | ORAL_TABLET | Freq: Two times a day (BID) | ORAL | Status: AC

## 2015-03-09 NOTE — ED Notes (Signed)
Pt A+Ox4, reports taking benadryl for environmental allergies last week.  Reports onset tongue swelling and facial/throat numbness/tingling last week that went away and returned again this AM.  Pt denies other new allergens.  +mild tongue swelling noted.  No difficulty clearing secretions or maintaining airway.  Speaking full/clear sentences, rr even/un-lab.  Skin PWD.  MAEI.  NAD.

## 2015-03-09 NOTE — Discharge Instructions (Signed)
Please follow the directions provided. Use the resource guide or the referrals given to establish care with a primary care doctor for follow-up. Take Benadryl every 6 hours as needed for itching or rash. Take the prednisone daily for the next 5 days. Take the Pepcid as directed. After the initial 5 days of treatment you may start taking Claritin daily to help prevent these symptoms. Don't hesitate to return for any new, worsening, or concerning symptoms.   SEEK IMMEDIATE MEDICAL CARE IF:  You have increasing pain, swelling, or redness.  You have a fever.  You have new or severe symptoms.  You have body aches, diarrhea, or vomiting.  Your rash is not better after 3 days.   Emergency Department Resource Guide 1) Find a Doctor and Pay Out of Pocket Although you won't have to find out who is covered by your insurance plan, it is a good idea to ask around and get recommendations. You will then need to call the office and see if the doctor you have chosen will accept you as a new patient and what types of options they offer for patients who are self-pay. Some doctors offer discounts or will set up payment plans for their patients who do not have insurance, but you will need to ask so you aren't surprised when you get to your appointment.  2) Contact Your Local Health Department Not all health departments have doctors that can see patients for sick visits, but many do, so it is worth a call to see if yours does. If you don't know where your local health department is, you can check in your phone book. The CDC also has a tool to help you locate your state's health department, and many state websites also have listings of all of their local health departments.  3) Find a Walk-in Clinic If your illness is not likely to be very severe or complicated, you may want to try a walk in clinic. These are popping up all over the country in pharmacies, drugstores, and shopping centers. They're usually staffed by  nurse practitioners or physician assistants that have been trained to treat common illnesses and complaints. They're usually fairly quick and inexpensive. However, if you have serious medical issues or chronic medical problems, these are probably not your best option.  No Primary Care Doctor: - Call Health Connect at  [phone] - they can help you locate a primary care doctor that  accepts your insurance, provides certain services, etc. - Physician Referral Service- [phone]  Chronic Pain Problems: Organization         Address  Phone   Notes  [facility]  [phone] Patients need to be referred by their primary care doctor.   Medication Assistance: Organization         Address  Phone   Notes  [facility] Medication [facility] [address] [place], [place] [address] [phone] --Must be a resident of [facility] [place] -- Must have NO insurance coverage whatsoever (no Medicaid/ Medicare, etc.) -- The pt. MUST have a primary care doctor that directs their care regularly and follows them in the community   MedAssist  [phone]   [employer]  [phone]    Agencies that provide inexpensive medical care: Organization         Address  Phone   Notes  [facility]  [phone]   [facility]    [phone]) [phone]  [facility] [place], [place] [address] [phone]   [place]. [address] [place] [phone]   Planned Parenthood    [phone]   [facility]    [phone]   [place] and [employer] [address], [place] Phone:  [phone], Fax:  [phone] Hours of Operation:  9 am - 6 pm, M-F.  Also accepts Medicaid/Medicare and self-pay.  [facility] for [address], [address], [place] Phone: [phone], Fax: [phone]. Hours of Operation:  8:30  am - 5:30 pm, M-F.  Also accepts Medicaid and self-pay.  [facility] [place] [address] [place] Phone: [phone]   [place], [place], [place] [phone], Ext. [phone] Mondays & Thursdays: 7-9 AM.  First 15 patients are seen on a first come, first serve basis.    [place] Providers:  Organization         Address  Phone   Notes  [facility] [address], [place] [phone] Also accepts self-pay patients.  [facility] [id number] [place], [place]  [phone]   [place], [address], [place] [phone]   [facility] [address], [place] [phone]   Lucianne Lei [address], [place]   [phone] Only accepts [place] Access [place] patients after they have their name applied to their card.   Self-Pay (no insurance) in [facility]:  Organization         Address  Phone   Notes  Sickle Cell Patients, [facility] Internal [facility] [place] [phone]   [facility] [place] [phone]   [facility] [place]  [place], [place], [place] [phone]   [facility]/Dr. Osei-Bonsu  [address], [place] or [address], [place] [phone] Phone number for both [place] and [place] locations is the same.  Urgent Medical and [facility] [address], [place] [phone]   [facility] [address], [place] or [address] [phone]   [facility] [address] [place] [phone], phone; [phone], fax Sees patients 1st and 3rd Saturday of every month.  Must not qualify for public or private insurance (i.e. Medicaid, Medicare, [place] Health  Choice, Veterans' Benefits)  Household income should be no more than 200% of the poverty level The clinic cannot treat you if you are pregnant or think you are pregnant  Sexually transmitted diseases are not treated at the clinic.    Dental Care: Organization         Address  Phone  Notes  [facility] Department of [facility] [phone] Accepts children up to age 15 who are enrolled in [place] or [place]; pregnant women with a Medicaid card; and children who have applied for Medicaid or [place] Health Choice, but were declined, whose parents can pay a reduced fee at time of service.  [facility] Department of [facility]  [address], [place] [phone] Accepts children up to age 54 who  are enrolled in Medicaid or [place] Health Choice; pregnant women with a Medicaid card; and children who have applied for Medicaid or [place] Health Choice, but were declined, whose parents can pay a reduced fee at time of service.  [place] Adult Dental Access PROGRAM  [place] [phone] Patients are seen by appointment only. Walk-ins are not accepted. [place] will see patients 27 years of age and older. Monday - Tuesday (8am-5pm) Most Wednesdays (8:30-5pm) $30 per visit, cash only  [facility] Adult [facility] Access PRO[facility]  [address], [facility] [phone] Patients are seen by appointment only. Walk-ins are not accepted. [place] will see patients 61 years of age and older. One Wednesday Evening (Monthly: Volunteer Based).  $30 per visit, cash only  [place]  [phone] for adults; Children under age 84, call Graduate Pediatric Dentistry at [phone]. Children aged 39-14, please call [phone] to request a pediatric application.  Dental services are provided in all areas of dental care including fillings, crowns and bridges, complete  and partial dentures, implants, gum treatment, root canals, and extractions. Preventive care is also provided. Treatment is provided to both adults and children. Patients are selected via a lottery and there is often a waiting list.   [facility] [address], [place]  [phone] [email or web address]   [facility] [address] [place], [place] [phone], Ext. [phone] Second and Fourth Thursday of each month, opens at 6:30 AM; Clinic ends at 9 AM.  Patients are seen on a first-come first-served basis, and a limited number are seen during each clinic.   [facility]  [address] [place], [place] [phone]   Eligibility Requirements You must have lived in [place], [place], or [place] counties for at least the last three months.   You cannot be eligible for state or federal sponsored [employer], including [employer], [place], or [employer].   You generally cannot be eligible for healthcare insurance through your employer.    How to apply: Eligibility screenings are held every Tuesday and Wednesday afternoon from 1:00 pm until 4:00 pm. You do not need an appointment for the interview!  [facility] [address], [place], [place]   [place]  [place] Department  [place]  [phone]    Behavioral Health Resources in the Community: Intensive Outpatient Programs Organization         Address  Phone  Notes  [place]. [address] [place], [place] [phone]   [facility] [address] [place], [place]   ADS: Alcohol & Drug Svcs [address], [place], [place]   [place] [address]  [place], [place] or [phone]   Substance Abuse Resources Organization          Address  Phone  Notes  Alcohol and Drug Services  [phone]   [place]  [phone]   [facility] [place]   Chinita Pester  [phone]   Residential & Outpatient Substance Abuse Program  [phone]   Psychological Services Organization         Address  Phone  Notes  [facility]  [place]  [phone]   [place] [address],  [place] [phone] or [phone]    Mobile Crisis Teams Organization         Address  Phone  Notes  [facility], Mobile Crisis Care Unit  [phone]   [facility]  [address] [place], [place] [phone]   Doristine LocksSharon DeEsch [address] [place] [phone]    Self-Help/Support Groups Organization         Address  Phone             Notes  [facility]oc. of [place] - variety of support groups  336- [phone] Call for more information  Narcotics Anonymous (NA), [facility]aring [facility] [address], [place]  2 meetings at this location   Statisticianesidential Treatment Programs Organization         Address  Phone  Notes  [facility] [address],    [place]  [phone]   [facility]  [address], [place] [phone], [place], [place] [phone]   [facility] [address], [place] [phone] Admissions: 8am-3pm M-F  [facility] [address]    [place], [place] [phone]   [facility] [address], [place], [place] [phone]   [facility] [address]  [place], [place] [phone]   [facility] - Intensive Outpatient [address], [place], [place] [phone]   [facility] [address]  [place], [place] [phone] or [phone]   [facility] Services (RTS) [address] [place], [place]  [phone] Accepts Medicaid  [facility] [address]  [place] [phone] Substance Abuse/Addiction Treatment   [facility] Behavioral Health Resources Organization         Address  Phone  Notes  [facility]  [phone]   Angie FavaJulie Brannon, PhD [address], [address] [place], [place]   [phone] or [phone]) [phone]   [facility]   [address] [place], [place] [phone]) [phone]   [facility] [address], [place], [place] [phone]) [phone] Insurance/Medicaid/sponsorship through [facility]  [facility] [address]                                    [place], [place] [phone](336) [phone] Therapy/tele-psych/case  [facility] [address].   [place], [place] [phone]) [phone]    Dr. Lolly MustacheArfeen  [phone]   [facility] [place] [place]  [employer] [facility] 1) [address], [place] 2) [address], [place] 3)  [address], [place] [phone]) [phone]  [phone](336) [phone]   [facility] Child Abuse Hotline [phone]) [phone] or [phone]) [phone] (After Hours)

## 2015-03-09 NOTE — ED Provider Notes (Signed)
CSN: [id number]     Arrival date & time 03/09/15  1006 History   None    Chief Complaint  Patient presents with  . Allergies    c/o tongue swelling, facial/throat tingling/numbess, relieved with benadryl last week but returned this AM   (Consider location/radiation/quality/duration/timing/severity/associated sxs/prior Treatment) HPI  Robin Abbott is a 34 year old female presenting with report of allergic reaction. She states 3 days ago she began sneezing, with itchy, watery eyes due to seasonal allergies. She also noticed a rash on her face. She took 1 dose of Benadryl and the symptoms improved. She was fine for the following 2 days. When she woke up this morning however she noticed the rash returning to her face and had sneezing and scratchy throat. She endorses a sensation of a swollen tongue, however she has no shortness of breath or difficulty breathing. She denies any difficulty swallowing, facial swelling, cough or wheezing or abdominal pain or nausea.  Past Medical History  Diagnosis Date  . Crohn's disease   . Asthma   . Anxiety   . History of kidney stones     DURING PRENGANCY  . Left ureteral calculus   . GERD (gastroesophageal reflux disease)   . Urgency of urination   . Complication of anesthesia     HARD TO WAKE POST T & A , AGE 7   Past Surgical History  Procedure Laterality Date  . Tubal ligation  11/05/2012    Procedure: POST PARTUM TUBAL LIGATION;  Surgeon: Adam Phenix, MD;  Location: [facility];  Service: Gynecology;  Laterality: Bilateral;  with clips  . Knee arthroscopy  11-18-1999    AND OPEN EXCISION OF JUMPERS KNEE W/ DRILLING INFERIOR POLE OF PATELLA  . Orif left ankle fx  02-26-2008  . Tonsillectomy and adenoidectomy  AGE 7  . Cystoscopy with retrograde pyelogram, ureteroscopy and stent placement Left 08/14/2013    Procedure: CYSTOSCOPY WITH RETROGRADE PYELOGRAM, URETEROSCOPY , dilation cayal seal;  Surgeon: Sebastian Ache, MD;  [facility];  Service: Urology;  Laterality: Left;  . Holmium laser application Left 08/14/2013    Procedure: HOLMIUM LASER APPLICATION;  Surgeon: Sebastian Ache, MD;  [facility];  Service: Urology;  Laterality: Left;  . Cystoscopy with stent placement Left 08/14/2013    Procedure: CYSTOSCOPY WITH STENT PLACEMENT;  Surgeon: Sebastian Ache, MD;  [facility];  Service: Urology;  Laterality: Left;   Family History  Problem Relation Age of Onset  . Diabetes Mother   . Hypertension Mother   . COPD Mother   . Depression Mother   . Anesthesia problems Mother     difficulty waking up and nausea and vomitting  . Hypertension Father   . Sleep apnea Father   . Diabetes Sister   . Heart disease Paternal Uncle   . Diabetes Maternal Grandmother   . Diabetes Maternal Grandfather   . Cancer Paternal Grandmother     cervical   History  Substance Use Topics  . Smoking status: Current Every Day Smoker -- 0.20 packs/day for 10 years    Types: Cigarettes  . Smokeless tobacco: Never Used  . Alcohol Use: No   OB History    Gravida Para Term Preterm AB TAB SAB Ectopic Multiple Living   0 1 0 1 0 0 2     Review of Systems  HENT: Positive for sneezing. Negative for trouble swallowing and voice change.   Skin: Positive for  rash.      Allergies  Hydrocodone; Keflex; and Tetracyclines & related  Home Medications   Prior to Admission medications   Medication Sig Start Date End Date Taking? Authorizing Provider  HYDROcodone-acetaminophen (NORCO/VICODIN) 5-325 MG per tablet Take 1-2 tablets by mouth every 6 (six) hours as needed for moderate pain or severe pain. 06/22/14   Junius Finner, PA-C  ibuprofen (ADVIL,MOTRIN) 200 MG tablet Take 800 mg by mouth every 6 (six) hours as needed for pain.    Historical Provider, MD  meloxicam (MOBIC) 7.5 MG tablet Take 2 tablets (15 mg total) by mouth daily. 06/22/14   Junius Finner, PA-C  methocarbamol  (ROBAXIN) 500 MG tablet Take 1 tablet (500 mg total) by mouth 2 (two) times daily. 06/22/14   Junius Finner, PA-C  sulfamethoxazole-trimethoprim (BACTRIM DS) 800-160 MG per tablet Take 1 tablet by mouth 2 (two) times daily. X 7 days. 08/14/13   Sebastian Ache, MD   BP 143/78 mmHg  Pulse 94  Temp(Src) 98.1 F (36.7 C) (Oral)  Resp 18  Ht  (1.626 m)  Wt 215 lb (97.523 kg)  BMI 36.89 kg/m2  SpO2 100% Physical Exam  Constitutional: She appears well-developed and well-nourished. No distress.  HENT:  Head: Normocephalic and atraumatic.  Mouth/Throat: Oropharynx is clear and moist.  No significant tongue swelling noted, no voice changes,   Eyes: Conjunctivae are normal.  Neck: Normal range of motion. Neck supple.  Cardiovascular: Normal rate, regular rhythm and intact distal pulses.   Pulmonary/Chest: Effort normal and breath sounds normal. No respiratory distress. She has no wheezes. She has no rales. She exhibits no tenderness.  Abdominal: Soft. There is no tenderness.  Musculoskeletal: She exhibits no tenderness.  Lymphadenopathy:    She has no cervical adenopathy.  Neurological: She is alert.  Skin: Skin is warm and dry. Rash noted. Rash is papular. She is not diaphoretic.  Mild papular rash on right side of face and neck   Psychiatric: She has a normal mood and affect.  Nursing note and vitals reviewed.   ED Course  Procedures (including critical care time) Labs Review Labs Reviewed - No data to display  Imaging Review No results found.   EKG Interpretation None      MDM   Final diagnoses:  Allergic reaction, initial encounter   34 yo patient with seasonal allergies, treated in the ED. She was re-evaluated prior to dc, is hemodynamically stable, in no respiratory distress, and denies the feeling of throat closing. Pt has been advised to take benadryl, pepcid and prednisone & return to the ED if they have a mod-severe allergic rxn (s/s including throat closing,  difficulty breathing, swelling of lips face or tongue). Pt is to follow up with their PCP. Pt is agreeable with plan & verbalizes understanding.   Filed Vitals:   03/09/15 1019 03/09/15 1133  BP: 143/78 123/76  Pulse: 94 75  Temp: 98.1 F (36.7 C)   TempSrc: Oral   Resp: 18 18  Height:  (1.626 m)   Weight: 215 lb (97.523 kg)   SpO2: 100% 100%   Meds given in ED:  Medications  predniSONE (DELTASONE) tablet 60 mg (60 mg Oral Given 03/09/15 1058)  famotidine (PEPCID) tablet 20 mg (20 mg Oral Given 03/09/15 1058)  loratadine (CLARITIN) tablet 10 mg (10 mg Oral Given 03/09/15 1058)    Discharge Medication List as of 03/09/2015 11:20 AM    START taking these medications   Details  diphenhydrAMINE (BENADRYL) 25 MG  tablet Take 1 tablet (25 mg total) by mouth every 6 (six) hours., Starting 03/09/2015, Until Discontinued, Print    famotidine (PEPCID) 20 MG tablet Take 1 tablet (20 mg total) by mouth 2 (two) times daily., Starting 03/09/2015, Until Discontinued, Print    loratadine (CLARITIN) 10 MG tablet Take 1 tablet (10 mg total) by mouth daily. After 5 days treatment with the benedryl, Starting 03/09/2015, Until Discontinued, Print    predniSONE (DELTASONE) 20 MG tablet Take 2 tablets (40 mg total) by mouth daily., Starting 03/09/2015, Until Discontinued, Print           Harle BattiestElizabeth Jelani Trueba, NP 03/10/15 1027  Arby BarretteMarcy Pfeiffer, MD 03/10/15 [phone]

## 2016-04-14 ENCOUNTER — Emergency Department (HOSPITAL_COMMUNITY)
Admission: EM | Admit: 2016-04-14 | Discharge: 2016-04-14 | Disposition: A | Discharge status: Home / Self Care | Payer: Medicaid Other | Attending: Emergency Medicine | Admitting: Emergency Medicine

## 2016-04-14 ENCOUNTER — Encounter (HOSPITAL_COMMUNITY): Payer: Self-pay | Admitting: Emergency Medicine

## 2016-04-14 DIAGNOSIS — K649 Unspecified hemorrhoids: Secondary | ICD-10-CM | POA: Insufficient documentation

## 2016-04-14 DIAGNOSIS — K219 Gastro-esophageal reflux disease without esophagitis: Secondary | ICD-10-CM | POA: Diagnosis not present

## 2016-04-14 DIAGNOSIS — Z8659 Personal history of other mental and behavioral disorders: Secondary | ICD-10-CM | POA: Diagnosis not present

## 2016-04-14 DIAGNOSIS — F17210 Nicotine dependence, cigarettes, uncomplicated: Secondary | ICD-10-CM | POA: Diagnosis not present

## 2016-04-14 DIAGNOSIS — J45909 Unspecified asthma, uncomplicated: Secondary | ICD-10-CM | POA: Insufficient documentation

## 2016-04-14 DIAGNOSIS — Z87442 Personal history of urinary calculi: Secondary | ICD-10-CM | POA: Insufficient documentation

## 2016-04-14 DIAGNOSIS — Z79899 Other long term (current) drug therapy: Secondary | ICD-10-CM | POA: Diagnosis not present

## 2016-04-14 MED ORDER — HYDROCORTISONE 2.5 % RE CREA: TOPICAL_CREAM | RECTAL | Status: AC

## 2016-04-14 NOTE — ED Notes (Signed)
Bed: WA23 Expected date:  Expected time:  Means of arrival:  Comments: Hold for triage 1 

## 2016-04-14 NOTE — ED Provider Notes (Signed)
CSN: [id number]     Arrival date & time 04/14/16  2013 History   First MD Initiated Contact with Patient 04/14/16 2156     Chief Complaint  Patient presents with  . Hemorrhoids   HPI   Robin Abbott is a 35 y.o. female PMH significant for hemorrhoids, Crohn's disease presenting with a 3 day history of hemorrhoid. She states the pain became worse today, despite trying preparation H cream and hemorrhoid wipes. The hemorrhoid began to bleed so she became concerned. No HA, fevers, chills, CP, SOB, weakness, dizziness, abdominal pain, N/V, changes in bowel/bladder habits.   Past Medical History  Diagnosis Date  . Crohn's disease (HCC)   . Asthma   . Anxiety   . History of kidney stones     DURING PRENGANCY  . Left ureteral calculus   . GERD (gastroesophageal reflux disease)   . Urgency of urination   . Complication of anesthesia     HARD TO WAKE POST T & A , AGE 34   Past Surgical History  Procedure Laterality Date  . Tubal ligation  11/05/2012    Procedure: POST PARTUM TUBAL LIGATION;  Surgeon: Adam PhenixJames G Arnold, MD;  Location: [facility];  Service: Gynecology;  Laterality: Bilateral;  with clips  . Knee arthroscopy  11-18-1999    AND OPEN EXCISION OF JUMPERS KNEE W/ DRILLING INFERIOR POLE OF PATELLA  . Orif left ankle fx  02-26-2008  . Tonsillectomy and adenoidectomy  AGE 34  . Cystoscopy with retrograde pyelogram, ureteroscopy and stent placement Left 08/14/2013    Procedure: CYSTOSCOPY WITH RETROGRADE PYELOGRAM, URETEROSCOPY , dilation cayal seal;  Surgeon: Sebastian Acheheodore Manny, MD;  [facility];  Service: Urology;  Laterality: Left;  . Holmium laser application Left 08/14/2013    Procedure: HOLMIUM LASER APPLICATION;  Surgeon: Sebastian Acheheodore Manny, MD;  [facility];  Service: Urology;  Laterality: Left;  . Cystoscopy with stent placement Left 08/14/2013    Procedure: CYSTOSCOPY WITH STENT PLACEMENT;  Surgeon: Sebastian Acheheodore Manny, MD;  [facility];  Service: Urology;  Laterality: Left;   Family History  Problem Relation Age of Onset  . Diabetes Mother   . Hypertension Mother   . COPD Mother   . Depression Mother   . Anesthesia problems Mother     difficulty waking up and nausea and vomitting  . Hypertension Father   . Sleep apnea Father   . Diabetes Sister   . Heart disease Paternal Uncle   . Diabetes Maternal Grandmother   . Diabetes Maternal Grandfather   . Cancer Paternal Grandmother     cervical   Social History  Substance Use Topics  . Smoking status: Current Every Day Smoker -- 0.20 packs/day for 10 years    Types: Cigarettes  . Smokeless tobacco: Never Used  . Alcohol Use: No   OB History    Gravida Para Term Preterm AB TAB SAB Ectopic Multiple Living   3 2 2  0 1 0 1 0 0 2     Review of Systems  Ten systems are reviewed and are negative for acute change except as noted in the HPI  Allergies  Hydrocodone; Keflex; and Tetracyclines & related  Home Medications   Prior to Admission medications   Medication Sig Start Date End Date Taking? Authorizing Provider  diphenhydrAMINE (BENADRYL) 25 MG tablet Take 1 tablet (25 mg total) by mouth every 6 (six) hours. 03/09/15   Harle BattiestElizabeth Tysinger, NP  famotidine (PEPCID) 20  MG tablet Take 1 tablet (20 mg total) by mouth 2 (two) times daily. 03/09/15   Harle Battiest, NP  HYDROcodone-acetaminophen (NORCO/VICODIN) 5-325 MG per tablet Take 1-2 tablets by mouth every 6 (six) hours as needed for moderate pain or severe pain. Patient not taking: Reported on 03/09/2015 06/22/14   Junius Finner, PA-C  hydrocortisone (ANUSOL-HC) 2.5 % rectal cream Apply rectally 2 times daily 04/14/16   Melton Krebs, PA-C  ibuprofen (ADVIL,MOTRIN) 200 MG tablet Take 400 mg by mouth every 6 (six) hours as needed for headache (headache).     Historical Provider, MD  loratadine (CLARITIN) 10 MG tablet Take 1 tablet (10 mg total) by mouth daily. After 5 days treatment with the  benedryl 03/09/15   Harle Battiest, NP  meloxicam (MOBIC) 7.5 MG tablet Take 2 tablets (15 mg total) by mouth daily. Patient not taking: Reported on 03/09/2015 06/22/14   Junius Finner, PA-C  methocarbamol (ROBAXIN) 500 MG tablet Take 1 tablet (500 mg total) by mouth 2 (two) times daily. Patient not taking: Reported on 03/09/2015 06/22/14   Junius Finner, PA-C  predniSONE (DELTASONE) 20 MG tablet Take 2 tablets (40 mg total) by mouth daily. 03/09/15   Harle Battiest, NP  sulfamethoxazole-trimethoprim (BACTRIM DS) 800-160 MG per tablet Take 1 tablet by mouth 2 (two) times daily. X 7 days. Patient not taking: Reported on 03/09/2015 08/14/13   Sebastian Ache, MD   BP 123/84 mmHg  Pulse 76  Temp(Src) 97.8 F (36.6 C) (Oral)  Resp 18  SpO2 100%  LMP 04/13/2016 Physical Exam  Constitutional: She appears well-developed and well-nourished. No distress.  HENT:  Head: Normocephalic and atraumatic.  Eyes: Conjunctivae are normal. Right eye exhibits no discharge. Left eye exhibits no discharge. No scleral icterus.  Neck: No tracheal deviation present.  Cardiovascular: Normal rate and regular rhythm.   Pulmonary/Chest: Effort normal and breath sounds normal. No respiratory distress.  Abdominal: Soft. She exhibits no distension.  Genitourinary:  Chaperoned digital rectal exam: 1.5 cm x 1 cm hemorrhoid with clot visualized. No other lesions.   Musculoskeletal: She exhibits no edema.  Neurological: She is alert. Coordination normal.  Skin: Skin is warm and dry. No rash noted. She is not diaphoretic. No erythema.  Psychiatric: She has a normal mood and affect. Her behavior is normal.  Nursing note and vitals reviewed.  ED Course  Procedures   MDM   Final diagnoses:  Hemorrhoids, unspecified hemorrhoid type   Patient nontoxic appearing, VSS. Patient with external hemorrhoid. Bleeding controlled. Patient hemodynamically stable. Patient may be safely discharged home with anusol ointment. Discussed  reasons for return. Patient to follow-up with primary care provider within one week. Patient in understanding and agreement with the plan.   Melton Krebs, PA-C 04/19/16 1341  Lorre Nick, MD 04/20/16 [phone]

## 2016-04-14 NOTE — Discharge Instructions (Signed)
Robin Abbott,  Nice meeting you! Please follow-up with your primary care provider. Return to the emergency department if you develop increased pain/bleeding, weakness, shortness of breath, new/worsening symptoms. Feel better soon!  S. Lane Hacker, PA-C Hemorrhoids Hemorrhoids are swollen veins around the rectum or anus. There are two types of hemorrhoids:  1. Internal hemorrhoids. These occur in the veins just inside the rectum. They may poke through to the outside and become irritated and painful. 2. External hemorrhoids. These occur in the veins outside the anus and can be felt as a painful swelling or hard lump near the anus. CAUSES  Pregnancy.   Obesity.   Constipation or diarrhea.   Straining to have a bowel movement.   Sitting for long periods on the toilet.  Heavy lifting or other activity that caused you to strain.  Anal intercourse. SYMPTOMS   Pain.   Anal itching or irritation.   Rectal bleeding.   Fecal leakage.   Anal swelling.   One or more lumps around the anus.  DIAGNOSIS  Your caregiver may be able to diagnose hemorrhoids by visual examination. Other examinations or tests that may be performed include:   Examination of the rectal area with a gloved hand (digital rectal exam).   Examination of anal canal using a small tube (scope).   A blood test if you have lost a significant amount of blood.  A test to look inside the colon (sigmoidoscopy or colonoscopy). TREATMENT Most hemorrhoids can be treated at home. However, if symptoms do not seem to be getting better or if you have a lot of rectal bleeding, your caregiver may perform a procedure to help make the hemorrhoids get smaller or remove them completely. Possible treatments include:   Placing a rubber band at the base of the hemorrhoid to cut off the circulation (rubber band ligation).   Injecting a chemical to shrink the hemorrhoid (sclerotherapy).   Using a tool to burn  the hemorrhoid (infrared light therapy).   Surgically removing the hemorrhoid (hemorrhoidectomy).   Stapling the hemorrhoid to block blood flow to the tissue (hemorrhoid stapling).  HOME CARE INSTRUCTIONS   Eat foods with fiber, such as whole grains, beans, nuts, fruits, and vegetables. Ask your doctor about taking products with added fiber in them (fibersupplements).  Increase fluid intake. Drink enough water and fluids to keep your urine clear or pale yellow.   Exercise regularly.   Go to the bathroom when you have the urge to have a bowel movement. Do not wait.   Avoid straining to have bowel movements.   Keep the anal area dry and clean. Use wet toilet paper or moist towelettes after a bowel movement.   Medicated creams and suppositories may be used or applied as directed.   Only take over-the-counter or prescription medicines as directed by your caregiver.   Take warm sitz baths for 15-20 minutes, 3-4 times a day to ease pain and discomfort.   Place ice packs on the hemorrhoids if they are tender and swollen. Using ice packs between sitz baths may be helpful.   Put ice in a plastic bag.   Place a towel between your skin and the bag.   Leave the ice on for 15-20 minutes, 3-4 times a day.   Do not use a donut-shaped pillow or sit on the toilet for long periods. This increases blood pooling and pain.  SEEK MEDICAL CARE IF:  You have increasing pain and swelling that is not controlled by treatment  or medicine.  You have uncontrolled bleeding.  You have difficulty or you are unable to have a bowel movement.  You have pain or inflammation outside the area of the hemorrhoids. MAKE SURE YOU:  Understand these instructions.  Will watch your condition.  Will get help right away if you are not doing well or get worse.   This information is not intended to replace advice given to you by your health care provider. Make sure you discuss any questions you  have with your health care provider.   Document Released: 11/18/2000 Document Revised: 11/07/2012 Document Reviewed: 09/25/2012 Elsevier Interactive Patient Education 2016 Elsevier Inc.   Disposable Sitz Bath A disposable sitz bath is a plastic basin that fits over the toilet. A bag is hung above the toilet and is connected to a tube that opens into the disposable sitz bath. The bag is filled with warm water that can flow into the basin through the tube.  HOW TO USE A DISPOSABLE SITZ BATH 3. Close the clamp on the tubing before filling the bag with water. This is to prevent leakage. 4. Fill the sitz bath basin and the plastic bag with warm water. 5. Place the filled basin on the toilet with the seat raised. Make sure the overflow opening is facing toward the back of the toilet. 6. Hang the filled plastic bag overhead on a hook or towel rack close to the toilet. When the bag is unclamped, a steady stream of water will flow from the bag, through the tubing, and into the basin. 7. Attach the tubing to the opening on the basin. 8. Sit on the basin positioned on the toilet seat and release the clamp. This will allow warm water to flush the area around your genitals and anus (perineum). 9. Remain sitting on the basin for approximately 15 to 20 minutes. 10. Stand up and pat the perineum area dry. If needed, apply clean bandages (dressings) to the affected area. 11. Tip the basin into the toilet to remove any remaining water and flush the toilet. 12. Wash the basin with warm water and soap. Let it dry in the sink. 13. Store the basin and tubing in a clean, dry area. 14. Wash your hands with soap and water. SEEK MEDICAL CARE IF: You get worse instead of better. Stop the sitz baths if you get worse. MAKE SURE YOU:  Understand these instructions.  Will watch your condition.  Will get help right away if you are not doing well or get worse.   This information is not intended to replace advice  given to you by your health care provider. Make sure you discuss any questions you have with your health care provider.   Document Released: 05/22/2012 Document Revised: 08/15/2012 Document Reviewed: 05/22/2012 Elsevier Interactive Patient Education [employer]2016 Elsevier Inc.

## 2016-04-14 NOTE — ED Notes (Signed)
Pt c/o pain with hemorrhoids that began 3 days ago.  Pain became severe today and she began to have bleeding as well.

## 2016-05-28 ENCOUNTER — Emergency Department (HOSPITAL_COMMUNITY)
Admission: EM | Admit: 2016-05-28 | Discharge: 2016-05-28 | Disposition: A | Discharge status: Home / Self Care | Payer: Medicaid Other | Attending: Emergency Medicine | Admitting: Emergency Medicine

## 2016-05-28 ENCOUNTER — Encounter (HOSPITAL_COMMUNITY): Payer: Self-pay | Admitting: Emergency Medicine

## 2016-05-28 DIAGNOSIS — F17210 Nicotine dependence, cigarettes, uncomplicated: Secondary | ICD-10-CM | POA: Insufficient documentation

## 2016-05-28 DIAGNOSIS — N390 Urinary tract infection, site not specified: Secondary | ICD-10-CM | POA: Insufficient documentation

## 2016-05-28 DIAGNOSIS — J45909 Unspecified asthma, uncomplicated: Secondary | ICD-10-CM | POA: Insufficient documentation

## 2016-05-28 LAB — URINALYSIS, ROUTINE W REFLEX MICROSCOPIC
BILIRUBIN URINE: NEGATIVE
Glucose, UA: NEGATIVE mg/dL
KETONES UR: NEGATIVE mg/dL
NITRITE: POSITIVE — AB
PROTEIN: 100 mg/dL — AB
SPECIFIC GRAVITY, URINE: 1.018 (ref 1.005–1.030)
pH: 7 (ref 5.0–8.0)

## 2016-05-28 LAB — URINE MICROSCOPIC-ADD ON

## 2016-05-28 LAB — PREGNANCY, URINE: Preg Test, Ur: NEGATIVE

## 2016-05-28 MED ORDER — PHENAZOPYRIDINE HCL 200 MG PO TABS: 200.0000 mg | ORAL_TABLET | Freq: Three times a day (TID) | ORAL | Status: AC

## 2016-05-28 MED ORDER — PHENAZOPYRIDINE HCL 200 MG PO TABS
200.0000 mg | ORAL_TABLET | Freq: Once | ORAL | Status: AC
  Administered 2016-05-28: 200 mg via ORAL
  Filled 2016-05-28: qty 1

## 2016-05-28 MED ORDER — ONDANSETRON HCL 4 MG PO TABS: 4.0000 mg | ORAL_TABLET | Freq: Four times a day (QID) | ORAL | Status: AC

## 2016-05-28 MED ORDER — ONDANSETRON 4 MG PO TBDP
4.0000 mg | ORAL_TABLET | Freq: Once | ORAL | Status: AC
  Administered 2016-05-28: 4 mg via ORAL
  Filled 2016-05-28: qty 1

## 2016-05-28 MED ORDER — SULFAMETHOXAZOLE-TRIMETHOPRIM 800-160 MG PO TABS: 1.0000 | ORAL_TABLET | Freq: Two times a day (BID) | ORAL | Status: AC

## 2016-05-28 MED ORDER — SULFAMETHOXAZOLE-TRIMETHOPRIM 800-160 MG PO TABS
1.0000 | ORAL_TABLET | Freq: Once | ORAL | Status: AC
  Administered 2016-05-28: 1 via ORAL
  Filled 2016-05-28: qty 1

## 2016-05-28 NOTE — ED Notes (Signed)
MD at bedside. 

## 2016-05-28 NOTE — ED Provider Notes (Signed)
CSN: [id number]     Arrival date & time 05/28/16  0736 History   First MD Initiated Contact with Patient 05/28/16 [phone]     Chief Complaint  Patient presents with  . Urinary Tract Infection  . Nausea  PT HAS A HX OF UTI, BUT NOT FOR A FEW YEARS. SHE SAID THAT SHE HAS HAD PAINFUL URINATION FOR THE PAST 3 DAYS.  SHE FELT NAUSEOUS YESTERDAY.  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  IT IS DIFFICULT FOR PT TO GET WATER.    (Consider location/radiation/quality/duration/timing/severity/associated sxs/prior Treatment) Patient is a 35 y.o. female presenting with urinary tract infection. The history is provided by the patient.  Urinary Tract Infection Pain quality:  Burning Pain severity:  Moderate Timing:  Constant Progression:  Worsening Chronicity:  Recurrent Recent urinary tract infections: no   Relieved by:  Nothing Worsened by:  Nothing tried Associated symptoms: nausea     Past Medical History  Diagnosis Date  . Crohn's disease (HCC)   . Asthma   . Anxiety   . History of kidney stones     DURING PRENGANCY  . Left ureteral calculus   . GERD (gastroesophageal reflux disease)   . Urgency of urination   . Complication of anesthesia     HARD TO WAKE POST T & A , AGE 83   Past Surgical History  Procedure Laterality Date  . Tubal ligation  11/05/2012    Procedure: POST PARTUM TUBAL LIGATION;  Surgeon: Adam PhenixJames G Arnold, MD;  Location: [facility];  Service: Gynecology;  Laterality: Bilateral;  with clips  . Knee arthroscopy  11-18-1999    AND OPEN EXCISION OF JUMPERS KNEE W/ DRILLING INFERIOR POLE OF PATELLA  . Orif left ankle fx  02-26-2008  . Tonsillectomy and adenoidectomy  AGE 83  . Cystoscopy with retrograde pyelogram, ureteroscopy and stent placement Left 08/14/2013    Procedure: CYSTOSCOPY WITH RETROGRADE PYELOGRAM, URETEROSCOPY , dilation cayal seal;  Surgeon: Sebastian Acheheodore Manny, MD;  [facility] [place];  Service: Urology;   Laterality: Left;  . Holmium laser application Left 08/14/2013    Procedure: HOLMIUM LASER APPLICATION;  Surgeon: Sebastian Acheheodore Manny, MD;  [facility];  Service: Urology;  Laterality: Left;  . Cystoscopy with stent placement Left 08/14/2013    Procedure: CYSTOSCOPY WITH STENT PLACEMENT;  Surgeon: Sebastian Acheheodore Manny, MD;  [facility] [place];  Service: Urology;  Laterality: Left;   Family History  Problem Relation Age of Onset  . Diabetes Mother   . Hypertension Mother   . COPD Mother   . Depression Mother   . Anesthesia problems Mother     difficulty waking up and nausea and vomitting  . Hypertension Father   . Sleep apnea Father   . Diabetes Sister   . Heart disease Paternal Uncle   . Diabetes Maternal Grandmother   . Diabetes Maternal Grandfather   . Cancer Paternal Grandmother     cervical   Social History  Substance Use Topics  . Smoking status: Current Every Day Smoker -- 0.20 packs/day for 10 years    Types: Cigarettes  . Smokeless tobacco: Never Used  . Alcohol Use: No   OB History    Gravida Para Term Preterm AB TAB SAB Ectopic Multiple Living   3 2 2  0 1 0 1 0 0 2     Review of Systems  Gastrointestinal: Positive for nausea.  Genitourinary: Positive  for dysuria.  All other systems reviewed and are negative.     Allergies  Hydrocodone; Keflex; and Tetracyclines & related  Home Medications   Prior to Admission medications   Medication Sig Start Date End Date Taking? Authorizing Provider  Multiple Vitamins-Minerals (MULTIVITAMIN ADULT PO) Take 1 tablet by mouth daily.   Yes Historical Provider, MD  diphenhydrAMINE (BENADRYL) 25 MG tablet Take 1 tablet (25 mg total) by mouth every 6 (six) hours. Patient not taking: Reported on 05/28/2016 03/09/15   Harle BattiestElizabeth Tysinger, NP  famotidine (PEPCID) 20 MG tablet Take 1 tablet (20 mg total) by mouth 2 (two) times daily. Patient not taking: Reported on 05/28/2016 03/09/15   Harle BattiestElizabeth  Tysinger, NP  hydrocortisone (ANUSOL-HC) 2.5 % rectal cream Apply rectally 2 times daily Patient not taking: Reported on 05/28/2016 04/14/16   Melton KrebsSamantha Nicole Riley, PA-C  ondansetron (ZOFRAN) 4 MG tablet Take 1 tablet (4 mg total) by mouth every 6 (six) hours. 05/28/16   Jacalyn LefevreJulie Mearle Drew, MD  phenazopyridine (PYRIDIUM) 200 MG tablet Take 1 tablet (200 mg total) by mouth 3 (three) times daily. 05/28/16   Jacalyn LefevreJulie Alethea Terhaar, MD  sulfamethoxazole-trimethoprim (BACTRIM DS,SEPTRA DS) 800-160 MG tablet Take 1 tablet by mouth 2 (two) times daily. 05/28/16 06/04/16  Jacalyn LefevreJulie Areesha Dehaven, MD   BP 131/82 mmHg  Pulse 85  Temp(Src) 98.2 F (36.8 C) (Oral)  Resp 16  SpO2 100%  LMP 05/13/2016 Physical Exam  Constitutional: She is oriented to person, place, and time. She appears well-developed and well-nourished.  HENT:  Head: Normocephalic and atraumatic.  Right Ear: External ear normal.  Left Ear: External ear normal.  Nose: Nose normal.  Mouth/Throat: Oropharynx is clear and moist.  Eyes: Conjunctivae and EOM are normal. Pupils are equal, round, and reactive to light.  Neck: Normal range of motion. Neck supple.  Cardiovascular: Normal rate, regular rhythm, normal heart sounds and intact distal pulses.   Pulmonary/Chest: Effort normal and breath sounds normal.  Abdominal: Soft. Bowel sounds are normal. There is tenderness in the suprapubic area.  Musculoskeletal: Normal range of motion.  Neurological: She is alert and oriented to person, place, and time.  Skin: Skin is warm and dry.  Psychiatric: She has a normal mood and affect. Her behavior is normal. Judgment and thought content normal.  Nursing note and vitals reviewed.   ED Course  Procedures (including critical care time) Labs Review Labs Reviewed  URINALYSIS, ROUTINE W REFLEX MICROSCOPIC (NOT AT [facility]) - Abnormal; Notable for the following:    APPearance TURBID (*)    Hgb urine dipstick LARGE (*)    Protein, ur 100 (*)    Nitrite POSITIVE (*)     Leukocytes, UA LARGE (*)    All other components within normal limits  URINE MICROSCOPIC-ADD ON - Abnormal; Notable for the following:    Squamous Epithelial / LPF 0-5 (*)    Bacteria, UA MANY (*)    All other components within normal limits  URINE CULTURE  PREGNANCY, URINE    Imaging Review No results found. I have personally reviewed and evaluated these images and lab results as part of my medical decision-making.   EKG Interpretation None      MDM  PT GIVEN A RX FOR BACTRIM, PYRIDIUM, AND ZOFRAN.  SHE IS GIVEN A NOTE FOR WORK FOR TODAY AND TOMORROW.  SHE IS ENCOURAGED TO DRINK LOTS OF FLUIDS.  SHE KNOWS TO RETURN IF WORSE. Final diagnoses:  UTI (lower urinary tract infection)      Jacalyn LefevreJulie Kemari Mares, MD  05/28/16 0912 

## 2016-05-28 NOTE — ED Notes (Signed)
Pt c/o painful urination for about 3 days now and nausea onset of yesterday. Pt states she had a UTI approx 4 years ago.

## 2016-05-30 LAB — URINE CULTURE: Culture: >=100000 — AB

## 2016-05-31 ENCOUNTER — Telehealth (HOSPITAL_BASED_OUTPATIENT_CLINIC_OR_DEPARTMENT_OTHER): Payer: Self-pay | Admitting: Emergency Medicine

## 2016-05-31 NOTE — Telephone Encounter (Signed)
Post ED Visit - Positive Culture Follow-up  Culture report reviewed by antimicrobial stewardship pharmacist:  []  Enzo BiNathan Batchelder, Pharm.D. []  Celedonio MiyamotoJeremy Frens, Pharm.D., BCPS []  Garvin FilaMike Maccia, Pharm.D. []  Georgina PillionElizabeth Martin, Pharm.D., BCPS []  [place]h Pham, Vermont[place]arm.D., BCPS, AAHIVP []  Estella HuskMichelle Turner, Pharm.D., BCPS, AAHIVP [x]  Tennis Mustassie Stewart, Pharm.D. []  Rob Oswaldo DoneVincent, [address]Pharm[address]  Positive urine  culture Treated with bactrim DS, organism sensitive to the same and no further patient follow-up is required at this time.  Berle MullMiller, Jeannie Mallinger 05/31/2016, 1:13 PM

## 2018-01-08 ENCOUNTER — Other Ambulatory Visit: Payer: Self-pay | Admitting: Radiology

## 2018-01-08 DIAGNOSIS — N63 Unspecified lump in unspecified breast: Secondary | ICD-10-CM

## 2018-01-15 ENCOUNTER — Ambulatory Visit
Admission: RE | Admit: 2018-01-15 | Discharge: 2018-01-15 | Disposition: A | Discharge status: Home / Self Care | Payer: PRIVATE HEALTH INSURANCE | Source: Ambulatory Visit | Attending: Radiology | Admitting: Radiology

## 2018-01-15 DIAGNOSIS — N63 Unspecified lump in unspecified breast: Secondary | ICD-10-CM

## 2022-04-18 DIAGNOSIS — E669 Obesity, unspecified: Secondary | ICD-10-CM | POA: Diagnosis not present

## 2022-04-18 DIAGNOSIS — I10 Essential (primary) hypertension: Secondary | ICD-10-CM | POA: Diagnosis not present

## 2022-04-18 DIAGNOSIS — F419 Anxiety disorder, unspecified: Secondary | ICD-10-CM | POA: Diagnosis not present

## 2022-04-18 DIAGNOSIS — S39012A Strain of muscle, fascia and tendon of lower back, initial encounter: Secondary | ICD-10-CM | POA: Diagnosis not present

## 2023-12-25 DIAGNOSIS — J45909 Unspecified asthma, uncomplicated: Secondary | ICD-10-CM | POA: Diagnosis not present

## 2023-12-25 DIAGNOSIS — I10 Essential (primary) hypertension: Secondary | ICD-10-CM | POA: Diagnosis not present

## 2024-01-04 DIAGNOSIS — H5789 Other specified disorders of eye and adnexa: Secondary | ICD-10-CM | POA: Diagnosis not present

## 2024-02-27 DIAGNOSIS — E785 Hyperlipidemia, unspecified: Secondary | ICD-10-CM | POA: Diagnosis not present

## 2024-02-27 DIAGNOSIS — Z0000 Encounter for general adult medical examination without abnormal findings: Secondary | ICD-10-CM | POA: Diagnosis not present

## 2024-02-27 DIAGNOSIS — Z6838 Body mass index (BMI) 38.0-38.9, adult: Secondary | ICD-10-CM | POA: Diagnosis not present

## 2024-04-02 DIAGNOSIS — Z1231 Encounter for screening mammogram for malignant neoplasm of breast: Secondary | ICD-10-CM | POA: Diagnosis not present

## 2024-09-03 DIAGNOSIS — E785 Hyperlipidemia, unspecified: Secondary | ICD-10-CM | POA: Diagnosis not present

## 2024-09-03 DIAGNOSIS — J45909 Unspecified asthma, uncomplicated: Secondary | ICD-10-CM | POA: Diagnosis not present

## 2024-09-03 DIAGNOSIS — J309 Allergic rhinitis, unspecified: Secondary | ICD-10-CM | POA: Diagnosis not present

## 2024-09-03 DIAGNOSIS — R7303 Prediabetes: Secondary | ICD-10-CM | POA: Diagnosis not present

## 2024-09-03 DIAGNOSIS — F419 Anxiety disorder, unspecified: Secondary | ICD-10-CM | POA: Diagnosis not present

## 2024-09-03 DIAGNOSIS — Z720 Tobacco use: Secondary | ICD-10-CM | POA: Diagnosis not present

## 2024-09-03 DIAGNOSIS — I10 Essential (primary) hypertension: Secondary | ICD-10-CM | POA: Diagnosis not present

## 2024-10-02 ENCOUNTER — Ambulatory Visit (INDEPENDENT_AMBULATORY_CARE_PROVIDER_SITE_OTHER)

## 2024-10-02 ENCOUNTER — Ambulatory Visit: Admitting: Podiatry

## 2024-10-02 DIAGNOSIS — M778 Other enthesopathies, not elsewhere classified: Secondary | ICD-10-CM

## 2024-10-02 DIAGNOSIS — M722 Plantar fascial fibromatosis: Secondary | ICD-10-CM

## 2024-10-02 DIAGNOSIS — M79671 Pain in right foot: Secondary | ICD-10-CM

## 2024-10-02 DIAGNOSIS — R2241 Localized swelling, mass and lump, right lower limb: Secondary | ICD-10-CM

## 2024-10-02 NOTE — Progress Notes (Unsigned)
 Chief Complaint  Patient presents with   Foot Pain    Right foot pain with a knot present in the arch, I did not mark it on xray, as I did not know it was there until I got her in the chair. Started hurting a month ago. It is palpable, and moves around. Wears work boots.  Not diabetic and no anti coag.    HPI: 43 y.o. female presents today with concern of a painful mass on the plantar aspect of the right foot.  States it has been present for approximately 1 month.  Weightbearing aggravates it.  Denies any bruising or redness to the area.  Past Medical History:  Diagnosis Date   Anxiety    Asthma    Complication of anesthesia    HARD TO WAKE POST T & A , AGE 86   Crohn's disease (HCC)    GERD (gastroesophageal reflux disease)    History of kidney stones    DURING PRENGANCY   Left ureteral calculus    Urgency of urination    Past Surgical History:  Procedure Laterality Date   CYSTOSCOPY WITH RETROGRADE PYELOGRAM, URETEROSCOPY AND STENT PLACEMENT Left 08/14/2013   Procedure: CYSTOSCOPY WITH RETROGRADE PYELOGRAM, URETEROSCOPY , dilation cayal seal;  Surgeon: Ricardo Likens, MD;  [facility];  Service: Urology;  Laterality: Left;   CYSTOSCOPY WITH STENT PLACEMENT Left 08/14/2013   Procedure: CYSTOSCOPY WITH STENT PLACEMENT;  Surgeon: Ricardo Likens, MD;  [facility];  Service: Urology;  Laterality: Left;   HOLMIUM LASER APPLICATION Left 08/14/2013   Procedure: HOLMIUM LASER APPLICATION;  Surgeon: Ricardo Likens, MD;  Location: [facility];  Service: Urology;  Laterality: Left;   KNEE ARTHROSCOPY  11-18-1999   AND OPEN EXCISION OF JUMPERS KNEE W/ DRILLING INFERIOR POLE OF PATELLA   ORIF LEFT ANKLE FX  02-26-2008   TONSILLECTOMY AND ADENOIDECTOMY  AGE 86   TUBAL LIGATION  11/05/2012   Procedure: POST PARTUM TUBAL LIGATION;  Surgeon: Lynwood KANDICE Solomons, MD;  Location: [facility];  Service: Gynecology;  Laterality: Bilateral;  with  clips   Allergies  Allergen Reactions   Hydrocodone  Hives    Given for a dental procedure yesterday   Keflex  [Cephalexin ] Other (See Comments)    MOUTH AND NOSE ULCERS / THRUSH   Tetracyclines & Related Nausea And Vomiting     Physical Exam: Palpable pedal pulses.  No open lesions are noted.  There is a palpable mass within the deeper soft tissues in the plantar arch.  This appears to be within the medial plantar fascial band.  Dorsiflexion of the foot and toes does make the mass more prominent/superficial.  This measures approximately the size of a dime and is round in nature.  It is nonpulsatile and nonecchymotic.  There is pain on palpation of the mass.  No other palpable masses are noted on the plantar aspect of the foot.  Epicritic sensation intact  Radiographic Exam (right foot, 3 weightbearing views, 10/02/2024):  Normal osseous mineralization. No fractures noted.  No foreign body noted.  No increase in soft tissue density in the area of the mass.  Assessment/Plan of Care: 1. Plantar fascial fibromatosis of right foot   2. Capsulitis of right foot   3. Mass of foot, right     I discussed findings with the patient.  Discussed of plantar fibroma and typical etiologies and treatment plan.  With the patient's consent, the plantar fibroma was injected with  a mixture of 1% lidocaine  plain, 0.5% Marcaine  plain and Kenalog 10 for total of 1.5 cc administered.  She tolerated this fairly well.  A Band-Aid was applied.  She can remove this later today.  She was also referred for physical therapy.  They can start performing ultrasound and deep tissue manipulation to the area postinjection to help break up the fibroma and help get this to resolve on its own.  Follow-up in 4 to 8 weeks for recheck   Joedy Eickhoff CHARM [place], DPM, FACFAS [facility]     [address] [place], [place] [address]                Office [phone]  Fax [phone]

## 2024-10-15 DIAGNOSIS — M79671 Pain in right foot: Secondary | ICD-10-CM | POA: Diagnosis not present

## 2024-10-15 DIAGNOSIS — R2689 Other abnormalities of gait and mobility: Secondary | ICD-10-CM | POA: Diagnosis not present

## 2024-10-29 DIAGNOSIS — M79671 Pain in right foot: Secondary | ICD-10-CM | POA: Diagnosis not present

## 2024-10-29 DIAGNOSIS — R2689 Other abnormalities of gait and mobility: Secondary | ICD-10-CM | POA: Diagnosis not present
